# Patient Record
Sex: Male | Born: 1960 | Race: White | Hispanic: No | Smoking: Former smoker
Health system: Southern US, Community
[De-identification: ages and names within clinical notes are randomized; demographics above are authoritative.]

## PROBLEM LIST (undated history)

## (undated) DIAGNOSIS — G459 Transient cerebral ischemic attack, unspecified: Secondary | ICD-10-CM

## (undated) DIAGNOSIS — I10 Essential (primary) hypertension: Secondary | ICD-10-CM

## (undated) DIAGNOSIS — K219 Gastro-esophageal reflux disease without esophagitis: Secondary | ICD-10-CM

## (undated) DIAGNOSIS — T25021A Burn of unspecified degree of right foot, initial encounter: Secondary | ICD-10-CM

## (undated) DIAGNOSIS — E119 Type 2 diabetes mellitus without complications: Secondary | ICD-10-CM

## (undated) DIAGNOSIS — K469 Unspecified abdominal hernia without obstruction or gangrene: Secondary | ICD-10-CM

## (undated) HISTORY — PX: ROTATOR CUFF REPAIR: SHX139

## (undated) HISTORY — PX: LEG AMPUTATION BELOW KNEE: SHX694

## (undated) HISTORY — PX: WISDOM TOOTH EXTRACTION: SHX21

---

## 2003-05-11 ENCOUNTER — Emergency Department (HOSPITAL_COMMUNITY): Admission: AD | Admit: 2003-05-11 | Discharge: 2003-05-11 | Payer: Self-pay | Admitting: Family Medicine

## 2003-05-12 ENCOUNTER — Emergency Department (HOSPITAL_COMMUNITY): Admission: AD | Admit: 2003-05-12 | Discharge: 2003-05-12 | Payer: Self-pay | Admitting: Family Medicine

## 2003-05-13 ENCOUNTER — Emergency Department (HOSPITAL_COMMUNITY): Admission: AD | Admit: 2003-05-13 | Discharge: 2003-05-13 | Payer: Self-pay | Admitting: Family Medicine

## 2004-09-01 ENCOUNTER — Emergency Department (HOSPITAL_COMMUNITY): Admission: EM | Admit: 2004-09-01 | Discharge: 2004-09-01 | Payer: Self-pay | Admitting: Family Medicine

## 2007-03-25 ENCOUNTER — Ambulatory Visit: Payer: Self-pay | Admitting: Internal Medicine

## 2007-03-25 LAB — CONVERTED CEMR LAB
ALT: 11 units/L (ref 0–53)
AST: 18 units/L (ref 0–37)
Basophils Absolute: 0.1 10*3/uL (ref 0.0–0.1)
Basophils Relative: 1 % (ref 0–1)
CO2: 23 meq/L (ref 19–32)
Cholesterol: 200 mg/dL (ref 0–200)
Creatinine, Ser: 0.81 mg/dL (ref 0.40–1.50)
Eosinophils Relative: 3 % (ref 0–5)
HCT: 46.1 % (ref 39.0–52.0)
Hemoglobin: 15.2 g/dL (ref 13.0–17.0)
MCHC: 33 g/dL (ref 30.0–36.0)
MCV: 93.3 fL (ref 78.0–100.0)
Monocytes Absolute: 0.7 10*3/uL (ref 0.2–0.7)
RDW: 13 % (ref 11.5–14.0)
Total Bilirubin: 0.4 mg/dL (ref 0.3–1.2)
Total CHOL/HDL Ratio: 5

## 2007-03-26 ENCOUNTER — Ambulatory Visit: Payer: Self-pay | Admitting: *Deleted

## 2007-04-02 ENCOUNTER — Ambulatory Visit: Payer: Self-pay | Admitting: Internal Medicine

## 2007-04-08 ENCOUNTER — Ambulatory Visit: Payer: Self-pay | Admitting: Internal Medicine

## 2009-01-07 ENCOUNTER — Observation Stay (HOSPITAL_COMMUNITY): Admission: EM | Admit: 2009-01-07 | Discharge: 2009-01-12 | Payer: Self-pay | Admitting: Emergency Medicine

## 2009-01-07 ENCOUNTER — Ambulatory Visit: Payer: Self-pay | Admitting: Internal Medicine

## 2009-01-08 ENCOUNTER — Encounter (INDEPENDENT_AMBULATORY_CARE_PROVIDER_SITE_OTHER): Payer: Self-pay | Admitting: Emergency Medicine

## 2010-09-25 LAB — BASIC METABOLIC PANEL
BUN: 12 mg/dL (ref 6–23)
CO2: 27 mEq/L (ref 19–32)
CO2: 28 mEq/L (ref 19–32)
Calcium: 9 mg/dL (ref 8.4–10.5)
Calcium: 9.3 mg/dL (ref 8.4–10.5)
Calcium: 9.4 mg/dL (ref 8.4–10.5)
Chloride: 102 mEq/L (ref 96–112)
Chloride: 96 mEq/L (ref 96–112)
Creatinine, Ser: 0.74 mg/dL (ref 0.4–1.5)
Creatinine, Ser: 0.75 mg/dL (ref 0.4–1.5)
Creatinine, Ser: 0.77 mg/dL (ref 0.4–1.5)
Creatinine, Ser: 0.84 mg/dL (ref 0.4–1.5)
GFR calc Af Amer: >60 mL/min (ref >60)
GFR calc Af Amer: >60 mL/min (ref >60)
GFR calc Af Amer: >60 mL/min (ref >60)
GFR calc Af Amer: >60 mL/min (ref >60)
GFR calc non Af Amer: >60 mL/min (ref >60)
GFR calc non Af Amer: >60 mL/min (ref >60)
Glucose, Bld: 213 mg/dL — ABNORMAL HIGH (ref 70–99)
Glucose, Bld: 235 mg/dL — ABNORMAL HIGH (ref 70–99)
Sodium: 133 mEq/L — ABNORMAL LOW (ref 135–145)
Sodium: 135 mEq/L (ref 135–145)
Sodium: 139 mEq/L (ref 135–145)

## 2010-09-25 LAB — GLUCOSE, CAPILLARY
Glucose-Capillary: 119 mg/dL — ABNORMAL HIGH (ref 70–99)
Glucose-Capillary: 165 mg/dL — ABNORMAL HIGH (ref 70–99)
Glucose-Capillary: 172 mg/dL — ABNORMAL HIGH (ref 70–99)
Glucose-Capillary: 189 mg/dL — ABNORMAL HIGH (ref 70–99)
Glucose-Capillary: 206 mg/dL — ABNORMAL HIGH (ref 70–99)
Glucose-Capillary: 222 mg/dL — ABNORMAL HIGH (ref 70–99)
Glucose-Capillary: 234 mg/dL — ABNORMAL HIGH (ref 70–99)
Glucose-Capillary: 273 mg/dL — ABNORMAL HIGH (ref 70–99)
Glucose-Capillary: 276 mg/dL — ABNORMAL HIGH (ref 70–99)

## 2010-09-25 LAB — CBC
HCT: 39.3 % (ref 39.0–52.0)
HCT: 40.5 % (ref 39.0–52.0)
HCT: 41.7 % (ref 39.0–52.0)
HCT: 42.7 % (ref 39.0–52.0)
Hemoglobin: 13.1 g/dL (ref 13.0–17.0)
Hemoglobin: 13.6 g/dL (ref 13.0–17.0)
Hemoglobin: 14.3 g/dL (ref 13.0–17.0)
Hemoglobin: 15.1 g/dL (ref 13.0–17.0)
MCHC: 34 g/dL (ref 30.0–36.0)
MCHC: 34.5 g/dL (ref 30.0–36.0)
MCHC: 34.8 g/dL (ref 30.0–36.0)
MCHC: 35 g/dL (ref 30.0–36.0)
MCV: 91 fL (ref 78.0–100.0)
MCV: 91.2 fL (ref 78.0–100.0)
MCV: 91.6 fL (ref 78.0–100.0)
MCV: 92.4 fL (ref 78.0–100.0)
Platelets: 207 10*3/uL (ref 150–400)
Platelets: 212 10*3/uL (ref 150–400)
Platelets: 217 10*3/uL (ref 150–400)
Platelets: 218 10*3/uL (ref 150–400)
Platelets: 235 10*3/uL (ref 150–400)
RBC: 4.11 MIL/uL — ABNORMAL LOW (ref 4.22–5.81)
RBC: 4.57 MIL/uL (ref 4.22–5.81)
RBC: 4.6 MIL/uL (ref 4.22–5.81)
RBC: 4.69 MIL/uL (ref 4.22–5.81)
RDW: 12.4 % (ref 11.5–15.5)
RDW: 12.5 % (ref 11.5–15.5)
RDW: 12.7 % (ref 11.5–15.5)
RDW: 12.8 % (ref 11.5–15.5)
WBC: 8.3 10*3/uL (ref 4.0–10.5)
WBC: 9.7 10*3/uL (ref 4.0–10.5)
WBC: 9.8 10*3/uL (ref 4.0–10.5)

## 2010-09-25 LAB — CK TOTAL AND CKMB (NOT AT ARMC)
CK, MB: 1.1 ng/mL (ref 0.3–4.0)
Relative Index: INVALID (ref 0.0–2.5)
Total CK: 32 U/L (ref 7–232)
Total CK: 37 U/L (ref 7–232)

## 2010-09-25 LAB — DIFFERENTIAL
Basophils Absolute: 0.1 10*3/uL (ref 0.0–0.1)
Basophils Relative: 1 % (ref 0–1)
Basophils Relative: 1 % (ref 0–1)
Eosinophils Absolute: 0.2 10*3/uL (ref 0.0–0.7)
Eosinophils Absolute: 0.3 10*3/uL (ref 0.0–0.7)
Eosinophils Relative: 2 % (ref 0–5)
Eosinophils Relative: 2 % (ref 0–5)
Eosinophils Relative: 4 % (ref 0–5)
Lymphocytes Relative: 15 % (ref 12–46)
Lymphocytes Relative: 25 % (ref 12–46)
Lymphs Abs: 1.8 10*3/uL (ref 0.7–4.0)
Lymphs Abs: 2.5 10*3/uL (ref 0.7–4.0)
Lymphs Abs: 2.7 10*3/uL (ref 0.7–4.0)
Monocytes Absolute: 0.8 10*3/uL (ref 0.1–1.0)
Monocytes Relative: 8 % (ref 3–12)
Monocytes Relative: 8 % (ref 3–12)
Monocytes Relative: 8 % (ref 3–12)
Neutro Abs: 9.1 10*3/uL — ABNORMAL HIGH (ref 1.7–7.7)
Neutrophils Relative %: 54 % (ref 43–77)
Neutrophils Relative %: 64 % (ref 43–77)
Neutrophils Relative %: 76 % (ref 43–77)

## 2010-09-25 LAB — CARDIAC PANEL(CRET KIN+CKTOT+MB+TROPI)
CK, MB: 0.9 ng/mL (ref 0.3–4.0)
Relative Index: INVALID (ref 0.0–2.5)
Relative Index: INVALID (ref 0.0–2.5)
Total CK: 39 U/L (ref 7–232)
Troponin I: 0.01 ng/mL (ref 0.00–0.06)
Troponin I: 0.02 ng/mL (ref 0.00–0.06)
Troponin I: 0.03 ng/mL (ref 0.00–0.06)

## 2010-09-25 LAB — PROTIME-INR: Prothrombin Time: 12.1 seconds (ref 11.6–15.2)

## 2010-09-25 LAB — COMPREHENSIVE METABOLIC PANEL
ALT: 12 U/L (ref 0–53)
BUN: 20 mg/dL (ref 6–23)
Calcium: 9.2 mg/dL (ref 8.4–10.5)
Creatinine, Ser: 1.06 mg/dL (ref 0.4–1.5)
Glucose, Bld: 329 mg/dL — ABNORMAL HIGH (ref 70–99)
Sodium: 130 mEq/L — ABNORMAL LOW (ref 135–145)
Total Protein: 6.8 g/dL (ref 6.0–8.3)

## 2010-09-25 LAB — TROPONIN I
Troponin I: 0.02 ng/mL (ref 0.00–0.06)
Troponin I: 0.03 ng/mL (ref 0.00–0.06)

## 2010-09-25 LAB — D-DIMER, QUANTITATIVE: D-Dimer, Quant: 1.06 ug/mL-FEU — ABNORMAL HIGH (ref 0.00–0.48)

## 2010-09-25 LAB — LIPID PANEL
Cholesterol: 236 mg/dL — ABNORMAL HIGH (ref 0–200)
LDL Cholesterol: UNDETERMINED mg/dL (ref 0–99)
Total CHOL/HDL Ratio: 6.2 RATIO
Triglycerides: 435 mg/dL — ABNORMAL HIGH (ref <150)
VLDL: UNDETERMINED mg/dL (ref 0–40)
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
## 2010-11-01 NOTE — Cardiovascular Report (Signed)
NAMEDRACEN, REIGLE               ACCOUNT NO.:  [id number]   MEDICAL RECORD NO.:  [id number]          PATIENT TYPE:  OBV   LOCATION:  4741                         FACILITY:  [facility]   PHYSICIAN:  Sheliah Mends, MD      DATE OF BIRTH:  February 18, 1961   DATE OF PROCEDURE:  01/11/2009  DATE OF DISCHARGE:                            CARDIAC CATHETERIZATION   Mr. Szumski is a 50 year old gentleman with history of tobacco  dependence and type 2 diabetes mellitus who presented to [facility] with new onset of chest pain.  He underwent a myocardial  perfusion scan as well as a transthoracic echocardiogram that showed  posterior and inferior hypokineses.  There were equivocal findings  regarding ischemia.  Given Mr. Ewalt's symptoms and risk factors, a  decision was made to proceed with cardiac catheterization.   PROCEDURE:  After informed consent was obtained, the patient was brought  to the Second Floor Cardiac Cath Lab in the postabsorptive state.  He  was draped in a sterile fashion.  Local anesthesia was applied to the  right groin using 1% lidocaine.  The patient was started on conscious  sedation with Versed and fentanyl.  A 5-French arterial sheath was  inserted into the right femoral artery using the modified Seldinger  technique.  Subsequently, a 5-French #4 Judkins left and right was used  for selective coronary angiography.  The 5-French right Judkins #4  catheter was exchanged for no torque catheter.  The pigtail catheter was  used for aortic root chart and left ventriculography.   FINDINGS:  Hemodynamics:  Left ventricular pressure 148/9 mmHg.  Aortic  pressure 149/68 mmHg.  No significant aortic valve gradient.   LEFT VENTRICULOGRAPHY:  Left ventriculography shows normal left  ventricular function with ejection fraction of 60%.   SELECTIVE CORONARY ANGIOGRAPHY:  1. Left main coronary artery:  Left main coronary artery is a short,      large caliber vessel that  bifurcates into the LAD and left      circumflex artery.  The left main coronary artery has no      angiographically significant disease.  2. Left anterior descending artery:  The left anterior descending      artery is a medium-sized vessel that gives 2 smaller diagonal      branches off.  There is no significant disease seen in the LAD      territory.  3. Left circumflex artery:  Left circumflex artery is a large vessel      that gives off a branching obtuse marginal branch 1 as well as a      PDA artery.  The left circumflex artery is a dominant vessel.      There is no angiographically significant disease seen.  4. Right coronary artery:  The right coronary artery is very small.  I      was unable using a 5-French right Judkins catheter and/or torque      catheter to selectively engage the right coronary artery.  A      subsequent aortic root chart showed a very small vessel.  SUMMARY:  1. No hemodynamic significant coronary artery disease.  2. No significant valve disease.  3. Normal left ventricular function.  4. 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   The procedure was completed without complication.   RECOMMENDATIONS:  Mr. Barthold should be continued on medical therapy  with smoking cessation and aggressive risk factor modification.      Sheliah Mends, MD  Electronically Signed     JE/MEDQ  D:  01/11/2009  T:  01/12/2009  Job:  [id number]

## 2010-11-01 NOTE — Discharge Summary (Signed)
Jeffrey Barber, Jeffrey Barber               ACCOUNT NO.:  [id number]   MEDICAL RECORD NO.:  [id number]          PATIENT TYPE:  OBV   LOCATION:  4741                         FACILITY:  [facility]   PHYSICIAN:  Theodosia Paling, MD    DATE OF BIRTH:  Apr 24, 1961   DATE OF ADMISSION:  01/06/2009  DATE OF DISCHARGE:                               DISCHARGE SUMMARY   PRIMARY CARE PHYSICIAN:  Armen Pickup, MD, at [facility].   ADMITTING HISTORY:  Please refer to my admission note dictated by me  under history of present illness.   DISCHARGE DIAGNOSIS:  Chest pain, likely noncardiac with negative  cardiac cath enzymes and EKG.   SECONDARY DIAGNOSES:  1. History of hypertension.  2. History of diabetes mellitus.  3. History of anxiety.  4. History of tobacco abuse.  5. History of alcohol abuse.   Discharge medications are the following:  1. Lisinopril 20 mg p.o. daily.  2. Hydrochlorothiazide 12.5 mg p.o. daily  3. Metformin 1 g p.o. q.12 h.  4. Ambien 2.5 mg p.o. at bedtime p.r.n.  5. Trazodone 50 mg p.o. at bedtime p.r.n.   HOSPITAL COURSE:  Following issues were addressed during the  hospitalization:  1. Chest pain.  The patient had several comorbid condition for CAD,      therefore cardiology consultation from [facility] was      performed.  He finally underwent cardiac catheterization on January 11, 2009, which showed no evidence of coronary artery disease and      normal ejection fraction.  The patient tolerated the procedure      fine.  He is symptomatic and hemodynamically stable.  His three      sets of cardiac enzymes and EKG was also normal.  2. Tobacco abuse.  The patient was put on nicotine patch, was      counseled against the use of tobacco.  3. Hypertension.  The patient will resume lisinopril and      hydrochlorothiazide on discharge.  4. Diabetes mellitus.  The patient will continue home dose of      metformin.   DISPOSITION:  The patient will follow up  with primary care physician in  1 week's time for the management of hypertension and diabetes.   PROCEDURE PERFORMED:  Consultation was performed as mentioned under  hospital course.  Echocardiogram performed on January 08, 2009, showed left  ventricle normal cavity site with normal left ventricular relaxation,  grade 1 diastolic dysfunction, and mild mitral valve regurgitation.      Theodosia Paling, MD  Electronically Signed     NP/MEDQ  D:  01/11/2009  T:  01/12/2009  Job:  [id number]   cc:   Armen Pickup, MD

## 2015-12-03 DIAGNOSIS — G459 Transient cerebral ischemic attack, unspecified: Secondary | ICD-10-CM

## 2015-12-03 HISTORY — DX: Transient cerebral ischemic attack, unspecified: G45.9

## 2016-06-19 DIAGNOSIS — T25021A Burn of unspecified degree of right foot, initial encounter: Secondary | ICD-10-CM

## 2016-06-19 HISTORY — DX: Burn of unspecified degree of right foot, initial encounter: T25.021A

## 2016-07-16 ENCOUNTER — Encounter (HOSPITAL_BASED_OUTPATIENT_CLINIC_OR_DEPARTMENT_OTHER): Payer: Self-pay | Admitting: Emergency Medicine

## 2016-07-16 ENCOUNTER — Inpatient Hospital Stay (HOSPITAL_BASED_OUTPATIENT_CLINIC_OR_DEPARTMENT_OTHER)
Admission: EM | Admit: 2016-07-16 | Discharge: 2016-07-25 | DRG: 271 | Disposition: A | Discharge status: Home / Self Care | Payer: Self-pay | Attending: Internal Medicine | Admitting: Internal Medicine

## 2016-07-16 ENCOUNTER — Emergency Department (HOSPITAL_BASED_OUTPATIENT_CLINIC_OR_DEPARTMENT_OTHER): Payer: Self-pay

## 2016-07-16 DIAGNOSIS — Z87891 Personal history of nicotine dependence: Secondary | ICD-10-CM

## 2016-07-16 DIAGNOSIS — E1165 Type 2 diabetes mellitus with hyperglycemia: Secondary | ICD-10-CM | POA: Diagnosis present

## 2016-07-16 DIAGNOSIS — E1152 Type 2 diabetes mellitus with diabetic peripheral angiopathy with gangrene: Secondary | ICD-10-CM

## 2016-07-16 DIAGNOSIS — Z833 Family history of diabetes mellitus: Secondary | ICD-10-CM

## 2016-07-16 DIAGNOSIS — T25021S Burn of unspecified degree of right foot, sequela: Secondary | ICD-10-CM

## 2016-07-16 DIAGNOSIS — Z7902 Long term (current) use of antithrombotics/antiplatelets: Secondary | ICD-10-CM

## 2016-07-16 DIAGNOSIS — L0390 Cellulitis, unspecified: Secondary | ICD-10-CM | POA: Diagnosis present

## 2016-07-16 DIAGNOSIS — Z888 Allergy status to other drugs, medicaments and biological substances status: Secondary | ICD-10-CM

## 2016-07-16 DIAGNOSIS — E1151 Type 2 diabetes mellitus with diabetic peripheral angiopathy without gangrene: Secondary | ICD-10-CM | POA: Diagnosis present

## 2016-07-16 DIAGNOSIS — K219 Gastro-esophageal reflux disease without esophagitis: Secondary | ICD-10-CM | POA: Diagnosis present

## 2016-07-16 DIAGNOSIS — L03115 Cellulitis of right lower limb: Secondary | ICD-10-CM | POA: Diagnosis present

## 2016-07-16 DIAGNOSIS — I70201 Unspecified atherosclerosis of native arteries of extremities, right leg: Secondary | ICD-10-CM | POA: Diagnosis present

## 2016-07-16 DIAGNOSIS — I96 Gangrene, not elsewhere classified: Principal | ICD-10-CM | POA: Diagnosis present

## 2016-07-16 DIAGNOSIS — I10 Essential (primary) hypertension: Secondary | ICD-10-CM | POA: Diagnosis present

## 2016-07-16 DIAGNOSIS — Z8673 Personal history of transient ischemic attack (TIA), and cerebral infarction without residual deficits: Secondary | ICD-10-CM

## 2016-07-16 DIAGNOSIS — T25221S Burn of second degree of right foot, sequela: Secondary | ICD-10-CM

## 2016-07-16 DIAGNOSIS — E781 Pure hyperglyceridemia: Secondary | ICD-10-CM | POA: Diagnosis present

## 2016-07-16 DIAGNOSIS — Z79899 Other long term (current) drug therapy: Secondary | ICD-10-CM

## 2016-07-16 DIAGNOSIS — E119 Type 2 diabetes mellitus without complications: Secondary | ICD-10-CM

## 2016-07-16 DIAGNOSIS — Z89512 Acquired absence of left leg below knee: Secondary | ICD-10-CM

## 2016-07-16 DIAGNOSIS — Z794 Long term (current) use of insulin: Secondary | ICD-10-CM

## 2016-07-16 DIAGNOSIS — E1142 Type 2 diabetes mellitus with diabetic polyneuropathy: Secondary | ICD-10-CM | POA: Diagnosis present

## 2016-07-16 DIAGNOSIS — X16XXXS Contact with hot heating appliances, radiators and pipes, sequela: Secondary | ICD-10-CM | POA: Diagnosis present

## 2016-07-16 HISTORY — DX: Essential (primary) hypertension: I10

## 2016-07-16 HISTORY — DX: Gastro-esophageal reflux disease without esophagitis: K21.9

## 2016-07-16 HISTORY — DX: Transient cerebral ischemic attack, unspecified: G45.9

## 2016-07-16 HISTORY — DX: Type 2 diabetes mellitus without complications: E11.9

## 2016-07-16 HISTORY — DX: Burn of unspecified degree of right foot, initial encounter: T25.021A

## 2016-07-16 HISTORY — DX: Unspecified abdominal hernia without obstruction or gangrene: K46.9

## 2016-07-16 LAB — CBC WITH DIFFERENTIAL/PLATELET
BAND NEUTROPHILS: 5 %
Basophils Absolute: 0.1 10*3/uL (ref 0.0–0.1)
Basophils Relative: 1 %
EOS PCT: 3 %
Eosinophils Absolute: 0.3 10*3/uL (ref 0.0–0.7)
HEMATOCRIT: 36.8 % — AB (ref 39.0–52.0)
HEMOGLOBIN: 12.7 g/dL — AB (ref 13.0–17.0)
LYMPHS ABS: 1.1 10*3/uL (ref 0.7–4.0)
Lymphocytes Relative: 12 %
MCH: 28.6 pg (ref 26.0–34.0)
MCHC: 34.5 g/dL (ref 30.0–36.0)
MCV: 82.9 fL (ref 78.0–100.0)
MONOS PCT: 6 %
Monocytes Absolute: 0.6 10*3/uL (ref 0.1–1.0)
NEUTROS ABS: 7.4 10*3/uL (ref 1.7–7.7)
Neutrophils Relative %: 73 %
Platelets: 294 10*3/uL (ref 150–400)
RBC: 4.44 MIL/uL (ref 4.22–5.81)
RDW: 13.6 % (ref 11.5–15.5)
WBC: 9.5 10*3/uL (ref 4.0–10.5)

## 2016-07-16 LAB — COMPREHENSIVE METABOLIC PANEL
ALBUMIN: 2.3 g/dL — AB (ref 3.5–5.0)
ALK PHOS: 109 U/L (ref 38–126)
ALT: 7 U/L — AB (ref 17–63)
AST: 20 U/L (ref 15–41)
Anion gap: 8 (ref 5–15)
BUN: 10 mg/dL (ref 6–20)
CALCIUM: 8.1 mg/dL — AB (ref 8.9–10.3)
CO2: 25 mmol/L (ref 22–32)
CREATININE: 1 mg/dL (ref 0.61–1.24)
Chloride: 99 mmol/L — ABNORMAL LOW (ref 101–111)
GFR calc non Af Amer: >60 mL/min (ref >60)
GLUCOSE: 250 mg/dL — AB (ref 65–99)
Potassium: 4.2 mmol/L (ref 3.5–5.1)
SODIUM: 132 mmol/L — AB (ref 135–145)
TOTAL PROTEIN: 6.4 g/dL — AB (ref 6.5–8.1)
Total Bilirubin: 0.4 mg/dL (ref 0.3–1.2)

## 2016-07-16 LAB — GLUCOSE, CAPILLARY: GLUCOSE-CAPILLARY: 204 mg/dL — AB (ref 65–99)

## 2016-07-16 LAB — TROPONIN I: Troponin I: <0.03 ng/mL (ref <0.03)

## 2016-07-16 LAB — I-STAT CG4 LACTIC ACID, ED
LACTIC ACID, VENOUS: 0.64 mmol/L (ref 0.5–1.9)
Lactic Acid, Venous: 0.72 mmol/L (ref 0.5–1.9)

## 2016-07-16 MED ORDER — VANCOMYCIN HCL IN DEXTROSE 1-5 GM/200ML-% IV SOLN
1000.0000 mg | Freq: Once | INTRAVENOUS | Status: AC
  Administered 2016-07-16: 1000 mg via INTRAVENOUS
  Filled 2016-07-16: qty 200

## 2016-07-16 MED ORDER — MORPHINE SULFATE (PF) 4 MG/ML IV SOLN
4.0000 mg | Freq: Once | INTRAVENOUS | Status: AC
  Administered 2016-07-16: 4 mg via INTRAVENOUS
  Filled 2016-07-16: qty 1

## 2016-07-16 MED ORDER — PIPERACILLIN-TAZOBACTAM 3.375 G IVPB 30 MIN
3.3750 g | Freq: Once | INTRAVENOUS | Status: AC
  Administered 2016-07-16: 3.375 g via INTRAVENOUS
  Filled 2016-07-16 (×2): qty 50

## 2016-07-16 MED ORDER — VANCOMYCIN HCL IN DEXTROSE 1-5 GM/200ML-% IV SOLN
1000.0000 mg | Freq: Two times a day (BID) | INTRAVENOUS | Status: DC
  Administered 2016-07-17 – 2016-07-18 (×4): 1000 mg via INTRAVENOUS
  Filled 2016-07-16 (×5): qty 200

## 2016-07-16 MED ORDER — PIPERACILLIN-TAZOBACTAM 3.375 G IVPB
3.3750 g | Freq: Three times a day (TID) | INTRAVENOUS | Status: DC
  Administered 2016-07-17 – 2016-07-22 (×15): 3.375 g via INTRAVENOUS
  Filled 2016-07-16 (×20): qty 50

## 2016-07-16 MED ORDER — SODIUM CHLORIDE 0.9 % IV BOLUS (SEPSIS)
1000.0000 mL | Freq: Once | INTRAVENOUS | Status: AC
  Administered 2016-07-16: 1000 mL via INTRAVENOUS

## 2016-07-16 NOTE — ED Notes (Addendum)
Dayshift EMT stated that the EKG was not done. This EMT will perform EKG as soon as the nurse finishes with blood cultures.
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
## 2016-07-16 NOTE — ED Notes (Signed)
Report to RuskinRonnie, RN on 5N.

## 2016-07-17 ENCOUNTER — Inpatient Hospital Stay (HOSPITAL_COMMUNITY): Payer: Self-pay

## 2016-07-17 ENCOUNTER — Encounter (HOSPITAL_COMMUNITY): Payer: Self-pay | Admitting: Internal Medicine

## 2016-07-17 DIAGNOSIS — I739 Peripheral vascular disease, unspecified: Secondary | ICD-10-CM

## 2016-07-17 DIAGNOSIS — I10 Essential (primary) hypertension: Secondary | ICD-10-CM | POA: Diagnosis present

## 2016-07-17 DIAGNOSIS — M79609 Pain in unspecified limb: Secondary | ICD-10-CM

## 2016-07-17 DIAGNOSIS — I96 Gangrene, not elsewhere classified: Secondary | ICD-10-CM | POA: Diagnosis present

## 2016-07-17 DIAGNOSIS — E119 Type 2 diabetes mellitus without complications: Secondary | ICD-10-CM

## 2016-07-17 DIAGNOSIS — T25021S Burn of unspecified degree of right foot, sequela: Secondary | ICD-10-CM

## 2016-07-17 LAB — BASIC METABOLIC PANEL
ANION GAP: 8 (ref 5–15)
BUN: 7 mg/dL (ref 6–20)
CO2: 26 mmol/L (ref 22–32)
Calcium: 8.3 mg/dL — ABNORMAL LOW (ref 8.9–10.3)
Chloride: 102 mmol/L (ref 101–111)
Creatinine, Ser: 1.09 mg/dL (ref 0.61–1.24)
Glucose, Bld: 191 mg/dL — ABNORMAL HIGH (ref 65–99)
POTASSIUM: 3.8 mmol/L (ref 3.5–5.1)
SODIUM: 136 mmol/L (ref 135–145)

## 2016-07-17 LAB — GLUCOSE, CAPILLARY
GLUCOSE-CAPILLARY: 191 mg/dL — AB (ref 65–99)
GLUCOSE-CAPILLARY: 224 mg/dL — AB (ref 65–99)
GLUCOSE-CAPILLARY: 298 mg/dL — AB (ref 65–99)
Glucose-Capillary: 188 mg/dL — ABNORMAL HIGH (ref 65–99)

## 2016-07-17 LAB — CBC WITH DIFFERENTIAL/PLATELET
BASOS ABS: 0.1 10*3/uL (ref 0.0–0.1)
Basophils Relative: 1 %
Eosinophils Absolute: 0.3 10*3/uL (ref 0.0–0.7)
Eosinophils Relative: 3 %
HEMATOCRIT: 37.7 % — AB (ref 39.0–52.0)
Hemoglobin: 12.6 g/dL — ABNORMAL LOW (ref 13.0–17.0)
LYMPHS PCT: 15 %
Lymphs Abs: 1.6 10*3/uL (ref 0.7–4.0)
MCH: 27.8 pg (ref 26.0–34.0)
MCHC: 33.4 g/dL (ref 30.0–36.0)
MCV: 83.2 fL (ref 78.0–100.0)
Monocytes Absolute: 1 10*3/uL (ref 0.1–1.0)
Monocytes Relative: 10 %
NEUTROS ABS: 7.3 10*3/uL (ref 1.7–7.7)
NEUTROS PCT: 71 %
Platelets: 288 10*3/uL (ref 150–400)
RBC: 4.53 MIL/uL (ref 4.22–5.81)
RDW: 13.6 % (ref 11.5–15.5)
WBC: 10.2 10*3/uL (ref 4.0–10.5)

## 2016-07-17 LAB — VAS US LOWER EXTREMITY ARTERIAL DUPLEX
RIGHT ANT DIST TIBAL SYS PSV: 62 cm/s
RIGHT POST TIB DIST SYS: -59 cm/s
RIGHT POST TIB MID SYS: 87 cm/s
RSFDPSV: -94 cm/s
RSFMPSV: -80 cm/s
RTSFADISTDIA: -25 cm/s
Right popliteal prox sys PSV: 95 cm/s
Right post tibial sys PSV: 70 cm/s
Right super femoral prox sys PSV: -148 cm/s

## 2016-07-17 LAB — C-REACTIVE PROTEIN: CRP: 6.6 mg/dL — ABNORMAL HIGH (ref <1.0)

## 2016-07-17 LAB — SEDIMENTATION RATE: Sed Rate: 79 mm/hr — ABNORMAL HIGH (ref 0–16)

## 2016-07-17 MED ORDER — FAMOTIDINE 20 MG PO TABS
20.0000 mg | ORAL_TABLET | Freq: Two times a day (BID) | ORAL | Status: DC
  Administered 2016-07-17 – 2016-07-25 (×17): 20 mg via ORAL
  Filled 2016-07-17 (×17): qty 1

## 2016-07-17 MED ORDER — LORAZEPAM 1 MG PO TABS
1.0000 mg | ORAL_TABLET | Freq: Three times a day (TID) | ORAL | Status: DC
  Administered 2016-07-17 – 2016-07-25 (×27): 1 mg via ORAL
  Filled 2016-07-17 (×27): qty 1

## 2016-07-17 MED ORDER — INSULIN ASPART 100 UNIT/ML ~~LOC~~ SOLN
0.0000 [IU] | Freq: Three times a day (TID) | SUBCUTANEOUS | Status: DC
  Administered 2016-07-17 (×2): 2 [IU] via SUBCUTANEOUS
  Administered 2016-07-17 – 2016-07-18 (×2): 3 [IU] via SUBCUTANEOUS

## 2016-07-17 MED ORDER — CLOPIDOGREL BISULFATE 75 MG PO TABS
75.0000 mg | ORAL_TABLET | Freq: Every day | ORAL | Status: DC
  Administered 2016-07-17 – 2016-07-25 (×8): 75 mg via ORAL
  Filled 2016-07-17 (×8): qty 1

## 2016-07-17 MED ORDER — METFORMIN HCL 500 MG PO TABS: 1000.0000 mg | ORAL_TABLET | Freq: Two times a day (BID) | ORAL | Status: DC

## 2016-07-17 MED ORDER — GLUCERNA SHAKE PO LIQD
237.0000 mL | Freq: Two times a day (BID) | ORAL | Status: DC
  Administered 2016-07-17 – 2016-07-25 (×12): 237 mL via ORAL
  Filled 2016-07-17: qty 237

## 2016-07-17 MED ORDER — GADOBENATE DIMEGLUMINE 529 MG/ML IV SOLN
15.0000 mL | Freq: Once | INTRAVENOUS | Status: AC | PRN
  Administered 2016-07-17: 15 mL via INTRAVENOUS

## 2016-07-17 MED ORDER — HYDRALAZINE HCL 25 MG PO TABS
25.0000 mg | ORAL_TABLET | Freq: Three times a day (TID) | ORAL | Status: DC
  Administered 2016-07-17 – 2016-07-18 (×5): 25 mg via ORAL
  Filled 2016-07-17 (×5): qty 1

## 2016-07-17 MED ORDER — HYDROCODONE-ACETAMINOPHEN 5-325 MG PO TABS
1.0000 | ORAL_TABLET | Freq: Four times a day (QID) | ORAL | Status: DC | PRN
  Administered 2016-07-17 – 2016-07-22 (×18): 2 via ORAL
  Filled 2016-07-17 (×18): qty 2

## 2016-07-17 MED ORDER — PRO-STAT SUGAR FREE PO LIQD
30.0000 mL | Freq: Every day | ORAL | Status: DC
  Administered 2016-07-17 – 2016-07-25 (×8): 30 mL via ORAL
  Filled 2016-07-17 (×8): qty 30

## 2016-07-17 MED ORDER — AMLODIPINE BESYLATE 5 MG PO TABS
5.0000 mg | ORAL_TABLET | Freq: Every day | ORAL | Status: DC
  Administered 2016-07-17: 5 mg via ORAL
  Filled 2016-07-17: qty 1

## 2016-07-17 MED ORDER — CLONIDINE HCL 0.2 MG PO TABS
0.3000 mg | ORAL_TABLET | Freq: Two times a day (BID) | ORAL | Status: DC
  Administered 2016-07-17 – 2016-07-25 (×17): 0.3 mg via ORAL
  Filled 2016-07-17 (×17): qty 1

## 2016-07-17 MED ORDER — ENOXAPARIN SODIUM 40 MG/0.4ML ~~LOC~~ SOLN
40.0000 mg | Freq: Every day | SUBCUTANEOUS | Status: DC
  Administered 2016-07-17 – 2016-07-24 (×8): 40 mg via SUBCUTANEOUS
  Filled 2016-07-17 (×9): qty 0.4

## 2016-07-17 MED ORDER — HYDROCODONE-ACETAMINOPHEN 5-325 MG PO TABS: 1.0000 | ORAL_TABLET | Freq: Four times a day (QID) | ORAL | Status: DC | PRN

## 2016-07-17 NOTE — Consult Note (Signed)
WOC Nurse wound consult note Reason for Consult: right toes  Patient with a history of PAD and amputation on the left leg just less than a year ago.  Family at the bedside with patient. 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 Dressing procedure/placement/frequency: Explained the difference to family and patient between vascular doctor and orthopedic surgeon.  Explained the need for good blood flow to the LE in order to heal wounds and the cause for black toes.  I explained my role was for topical care recommendations but due to the lack of blood flow a vascular surgeon or orthopedic surgeon would be making any decisions about further interventions.   I have added betadine topically in hopes of keeping the toes dry and stable.  Discussed POC with patient and bedside nurse.  Re consult if needed, will not follow at this time. Thanks  Javious Hallisey M.D.C. [employer] MSN, RN,CWOCN, CNS [phone])

## 2016-07-17 NOTE — Progress Notes (Signed)
Inpatient Diabetes Program Recommendations  AACE/ADA: New Consensus Statement on Inpatient Glycemic Control (2015)  Target Ranges:  Prepandial:   less than 140 mg/dL      Peak postprandial:   less than 180 mg/dL (1-2 hours)      Critically ill patients:  140 - 180 mg/dL   Results for Jeffrey Barber, [name] (MRN [id number]) as of 07/17/2016 09:17  Ref. Range 07/16/2016 23:44 07/17/2016 06:10  Glucose-Capillary Latest Ref Range: 65 - 99 mg/dL 782204 (H) 956191 (H)    Admit with: Gangrene R Foot  History: DM2  Home DM Meds: Metformin 1000 BID  Current Insulin Orders: Novolog Sensitive Correction Scale/ SSI (0-9 units) TID AC        -Note Novolog Sensitive SSI just started this AM.    MD- Please consider placing order for current Hemoglobin A1c level     --Will follow patient during hospitalization--  Ambrose FinlandJeannine Johnston Hodari Chuba RN, MSN, CDE Diabetes Coordinator Inpatient Glycemic Control Team Team Pager: [phone] (8a-5p)
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
## 2016-07-17 NOTE — Progress Notes (Addendum)
VASCULAR LAB PRELIMINARY  ARTERIAL  ABI completed:    RIGHT    LEFT    PRESSURE WAVEFORM  PRESSURE WAVEFORM  BRACHIAL IV site Triphasic BRACHIAL 151 Triphasic  DP 81 Monophasic DP Amputation   PT 100 Biphasic PT Amputation     RIGHT LEFT  ABI 0.66 Amputation   Right ABI indicates a moderate reduction in arterial flow at rest.  Menucha Dicesare, RVS 07/17/2016, 12:20 PM

## 2016-07-17 NOTE — Progress Notes (Signed)
VASCULAR LAB PRELIMINARY  PRELIMINARY  PRELIMINARY  PRELIMINARY  Duplex scan of the right lower extremity completed.    Preliminary report:  Duplex scan of the right lower extremity revealed abnormal Doppler waveforms throughout the leg. A significant stenosis greater than 50% was noted in the mid to distal superficial femoral artery with no evidenceof a total occlusion.  Katiejo Gilroy, RVS 07/17/2016, 1:49 PM
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
## 2016-07-17 NOTE — Progress Notes (Signed)
I have seen and assessed patient and agree with Dr Gardner's assessment and plan. 

## 2016-07-18 LAB — BASIC METABOLIC PANEL
Anion gap: 9 (ref 5–15)
BUN: 8 mg/dL (ref 6–20)
CALCIUM: 8.6 mg/dL — AB (ref 8.9–10.3)
CO2: 29 mmol/L (ref 22–32)
Chloride: 101 mmol/L (ref 101–111)
Creatinine, Ser: 1.05 mg/dL (ref 0.61–1.24)
GFR calc Af Amer: >60 mL/min (ref >60)
GLUCOSE: 208 mg/dL — AB (ref 65–99)
POTASSIUM: 3.8 mmol/L (ref 3.5–5.1)
Sodium: 139 mmol/L (ref 135–145)

## 2016-07-18 LAB — CBC
HEMATOCRIT: 36.2 % — AB (ref 39.0–52.0)
Hemoglobin: 12.3 g/dL — ABNORMAL LOW (ref 13.0–17.0)
MCH: 28.5 pg (ref 26.0–34.0)
MCHC: 34 g/dL (ref 30.0–36.0)
MCV: 83.8 fL (ref 78.0–100.0)
Platelets: 295 10*3/uL (ref 150–400)
RBC: 4.32 MIL/uL (ref 4.22–5.81)
RDW: 13.6 % (ref 11.5–15.5)
WBC: 10.2 10*3/uL (ref 4.0–10.5)

## 2016-07-18 LAB — GLUCOSE, CAPILLARY
GLUCOSE-CAPILLARY: 212 mg/dL — AB (ref 65–99)
GLUCOSE-CAPILLARY: 200 mg/dL — AB (ref 65–99)
Glucose-Capillary: 179 mg/dL — ABNORMAL HIGH (ref 65–99)

## 2016-07-18 MED ORDER — PRAVASTATIN SODIUM 20 MG PO TABS
20.0000 mg | ORAL_TABLET | Freq: Every day | ORAL | Status: DC
  Administered 2016-07-18 – 2016-07-25 (×8): 20 mg via ORAL
  Filled 2016-07-18 (×8): qty 1

## 2016-07-18 MED ORDER — AMLODIPINE BESYLATE 10 MG PO TABS
10.0000 mg | ORAL_TABLET | Freq: Every day | ORAL | Status: DC
Start: 2016-07-18 — End: 2016-07-26
  Administered 2016-07-18 – 2016-07-25 (×7): 10 mg via ORAL
  Filled 2016-07-18 (×7): qty 1

## 2016-07-18 MED ORDER — HYDRALAZINE HCL 50 MG PO TABS
50.0000 mg | ORAL_TABLET | Freq: Three times a day (TID) | ORAL | Status: DC
  Administered 2016-07-19 – 2016-07-21 (×8): 50 mg via ORAL
  Filled 2016-07-18 (×9): qty 1

## 2016-07-18 MED ORDER — HYDRALAZINE HCL 25 MG PO TABS
25.0000 mg | ORAL_TABLET | Freq: Once | ORAL | Status: AC
  Administered 2016-07-18: 25 mg via ORAL
  Filled 2016-07-18: qty 1

## 2016-07-18 MED ORDER — INSULIN ASPART 100 UNIT/ML ~~LOC~~ SOLN
0.0000 [IU] | Freq: Three times a day (TID) | SUBCUTANEOUS | Status: DC
  Administered 2016-07-18 (×2): 3 [IU] via SUBCUTANEOUS
  Administered 2016-07-19 (×2): 2 [IU] via SUBCUTANEOUS
  Administered 2016-07-19: 8 [IU] via SUBCUTANEOUS
  Administered 2016-07-20 (×2): 5 [IU] via SUBCUTANEOUS
  Administered 2016-07-20: 3 [IU] via SUBCUTANEOUS
  Administered 2016-07-21: 5 [IU] via SUBCUTANEOUS
  Administered 2016-07-22: 8 [IU] via SUBCUTANEOUS
  Administered 2016-07-22: 3 [IU] via SUBCUTANEOUS
  Administered 2016-07-22: 8 [IU] via SUBCUTANEOUS
  Administered 2016-07-23 – 2016-07-25 (×4): 2 [IU] via SUBCUTANEOUS
  Administered 2016-07-25: 3 [IU] via SUBCUTANEOUS

## 2016-07-18 MED ORDER — INSULIN DETEMIR 100 UNIT/ML ~~LOC~~ SOLN: 5.0000 [IU] | Freq: Every day | SUBCUTANEOUS | Status: DC

## 2016-07-18 MED ORDER — INSULIN DETEMIR 100 UNIT/ML ~~LOC~~ SOLN
5.0000 [IU] | Freq: Every day | SUBCUTANEOUS | Status: DC
Start: 2016-07-18 — End: 2016-07-21
  Administered 2016-07-18 – 2016-07-20 (×3): 5 [IU] via SUBCUTANEOUS
  Filled 2016-07-18 (×4): qty 0.05
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
## 2016-07-18 NOTE — Progress Notes (Addendum)
Inpatient Diabetes Program Recommendations  AACE/ADA: New Consensus Statement on Inpatient Glycemic Control (2015)  Target Ranges:  Prepandial:   less than 140 mg/dL      Peak postprandial:   less than 180 mg/dL (1-2 hours)      Critically ill patients:  140 - 180 mg/dL   Review of Glycemic Control  Diabetes history: DM 2 Outpatient Diabetes medications: Levemir 5 units QHS, Metformin 1000 mg BID Current orders for Inpatient glycemic control: Levemir 5 units QHS, Novolog Sensitive TID  Inpatient Diabetes Program Recommendations:   Patient to start Levemir today. Consider adjusting Levemir dose for patient to get this am. Consider increasing correction to Novolog Moderate.  Consider A1c level to assess glucose control over the past 2-3 months.  Thanks,  Christena DeemShannon Brycelyn Gambino RN, MSN, [facility] Inpatient Diabetes Coordinator Team Pager [phone] (8a-5p)

## 2016-07-18 NOTE — Progress Notes (Signed)
Applied betadine soln to right toes as per wound care request. Air dried. Will continue to monitor.

## 2016-07-19 ENCOUNTER — Encounter (HOSPITAL_COMMUNITY): Payer: Self-pay | Admitting: General Practice

## 2016-07-19 DIAGNOSIS — I10 Essential (primary) hypertension: Secondary | ICD-10-CM

## 2016-07-19 DIAGNOSIS — L03031 Cellulitis of right toe: Secondary | ICD-10-CM

## 2016-07-19 LAB — BASIC METABOLIC PANEL
Anion gap: 9 (ref 5–15)
BUN: 7 mg/dL (ref 6–20)
CO2: 26 mmol/L (ref 22–32)
CREATININE: 1.15 mg/dL (ref 0.61–1.24)
Calcium: 8.4 mg/dL — ABNORMAL LOW (ref 8.9–10.3)
Chloride: 99 mmol/L — ABNORMAL LOW (ref 101–111)
GFR calc Af Amer: >60 mL/min (ref >60)
GLUCOSE: 155 mg/dL — AB (ref 65–99)
POTASSIUM: 3.8 mmol/L (ref 3.5–5.1)
SODIUM: 134 mmol/L — AB (ref 135–145)

## 2016-07-19 LAB — CBC
HCT: 38.1 % — ABNORMAL LOW (ref 39.0–52.0)
Hemoglobin: 12.5 g/dL — ABNORMAL LOW (ref 13.0–17.0)
MCH: 27.7 pg (ref 26.0–34.0)
MCHC: 32.8 g/dL (ref 30.0–36.0)
MCV: 84.5 fL (ref 78.0–100.0)
PLATELETS: 305 10*3/uL (ref 150–400)
RBC: 4.51 MIL/uL (ref 4.22–5.81)
RDW: 13.9 % (ref 11.5–15.5)
WBC: 10.9 10*3/uL — ABNORMAL HIGH (ref 4.0–10.5)

## 2016-07-19 LAB — GLUCOSE, CAPILLARY
GLUCOSE-CAPILLARY: 114 mg/dL — AB (ref 65–99)
GLUCOSE-CAPILLARY: 147 mg/dL — AB (ref 65–99)
Glucose-Capillary: 177 mg/dL — ABNORMAL HIGH (ref 65–99)
Glucose-Capillary: 149 mg/dL — ABNORMAL HIGH (ref 65–99)
Glucose-Capillary: 291 mg/dL — ABNORMAL HIGH (ref 65–99)

## 2016-07-19 LAB — LIPID PANEL
CHOL/HDL RATIO: 2.5 ratio
CHOLESTEROL: 132 mg/dL (ref 0–200)
HDL: 52 mg/dL (ref >40)
LDL Cholesterol: 41 mg/dL (ref 0–99)
TRIGLYCERIDES: 197 mg/dL — AB (ref <150)
VLDL: 39 mg/dL (ref 0–40)
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
## 2016-07-20 DIAGNOSIS — I96 Gangrene, not elsewhere classified: Principal | ICD-10-CM

## 2016-07-20 DIAGNOSIS — E781 Pure hyperglyceridemia: Secondary | ICD-10-CM

## 2016-07-20 LAB — GLUCOSE, CAPILLARY
GLUCOSE-CAPILLARY: 203 mg/dL — AB (ref 65–99)
GLUCOSE-CAPILLARY: 235 mg/dL — AB (ref 65–99)
Glucose-Capillary: 162 mg/dL — ABNORMAL HIGH (ref 65–99)
Glucose-Capillary: 133 mg/dL — ABNORMAL HIGH (ref 65–99)

## 2016-07-20 LAB — HEMOGLOBIN A1C
HEMOGLOBIN A1C: 12.7 % — AB (ref 4.8–5.6)
MEAN PLASMA GLUCOSE: 318 mg/dL

## 2016-07-20 LAB — SURGICAL PCR SCREEN
MRSA, PCR: NEGATIVE
Staphylococcus aureus: NEGATIVE

## 2016-07-20 NOTE — Progress Notes (Signed)
Pt seen and examined. Toes unchanged.  Agram tomorrow.  Questions answered.  Fabienne Brunsharles Rufus Beske, MD [facility] [place] Office: [phone] Pager: [phone]

## 2016-07-20 NOTE — Progress Notes (Signed)
Toes to R foot painted with betadine and left open to air per wound care order.

## 2016-07-20 NOTE — Progress Notes (Addendum)
Inpatient Diabetes Program Recommendations  AACE/ADA: New Consensus Statement on Inpatient Glycemic Control (2015)  Target Ranges:  Prepandial:   less than 140 mg/dL      Peak postprandial:   less than 180 mg/dL (1-2 hours)      Critically ill patients:  140 - 180 mg/dL   Lab Results  Component Value Date   GLUCAP 203 (H) 07/20/2016   HGBA1C 12.7 (H) 07/19/2016   Results for [name], Brynden D (MRN [id number]) as of 07/20/2016 11:31  Ref. Range 07/19/2016 00:35 07/19/2016 06:37 07/19/2016 17:50 07/19/2016 22:00 07/20/2016 06:25  Glucose-Capillary Latest Ref Range: 65 - 99 mg/dL 191147 (H) 478149 (H) 295291 (H) 114 (H) 203 (H)   Current orders for Inpatient glycemic control:   Inpatient Diabetes Program Recommendations, in order to improve healing, please consider:  Meal coverage of Novolog 4 units TIDAC if patient eats > 50% of meal;  Decreasing to Sensitive correction scale Novolog 0-9 units TIDAC.  Thank you,  Kristine LineaKaren Kathrine Rieves, RN, MSN Diabetes Coordinator Inpatient Diabetes Program [phone] (Team Pager)
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
## 2016-07-21 ENCOUNTER — Encounter (HOSPITAL_COMMUNITY)
Admission: EM | Disposition: A | Discharge status: Home / Self Care | Payer: Self-pay | Source: Home / Self Care | Attending: Internal Medicine

## 2016-07-21 ENCOUNTER — Encounter (HOSPITAL_COMMUNITY): Payer: Self-pay | Admitting: Vascular Surgery

## 2016-07-21 DIAGNOSIS — I96 Gangrene, not elsewhere classified: Secondary | ICD-10-CM

## 2016-07-21 HISTORY — PX: PERIPHERAL VASCULAR CATHETERIZATION: SHX172C

## 2016-07-21 LAB — CBC
HCT: 37 % — ABNORMAL LOW (ref 39.0–52.0)
Hemoglobin: 12.4 g/dL — ABNORMAL LOW (ref 13.0–17.0)
MCH: 28.2 pg (ref 26.0–34.0)
MCHC: 33.5 g/dL (ref 30.0–36.0)
MCV: 84.1 fL (ref 78.0–100.0)
PLATELETS: 337 10*3/uL (ref 150–400)
RBC: 4.4 MIL/uL (ref 4.22–5.81)
RDW: 13.7 % (ref 11.5–15.5)
WBC: 10.5 10*3/uL (ref 4.0–10.5)

## 2016-07-21 LAB — HEMOGLOBIN A1C
HEMOGLOBIN A1C: 12.7 % — AB (ref 4.8–5.6)
Mean Plasma Glucose: 318 mg/dL

## 2016-07-21 LAB — GLUCOSE, CAPILLARY
GLUCOSE-CAPILLARY: 170 mg/dL — AB (ref 65–99)
GLUCOSE-CAPILLARY: 209 mg/dL — AB (ref 65–99)
Glucose-Capillary: 152 mg/dL — ABNORMAL HIGH (ref 65–99)
Glucose-Capillary: 163 mg/dL — ABNORMAL HIGH (ref 65–99)
Glucose-Capillary: 164 mg/dL — ABNORMAL HIGH (ref 65–99)
Glucose-Capillary: 205 mg/dL — ABNORMAL HIGH (ref 65–99)

## 2016-07-21 LAB — BASIC METABOLIC PANEL
Anion gap: 11 (ref 5–15)
BUN: 10 mg/dL (ref 6–20)
CHLORIDE: 100 mmol/L — AB (ref 101–111)
CO2: 26 mmol/L (ref 22–32)
CREATININE: 1.16 mg/dL (ref 0.61–1.24)
Calcium: 8.8 mg/dL — ABNORMAL LOW (ref 8.9–10.3)
GFR calc Af Amer: >60 mL/min (ref >60)
GFR calc non Af Amer: >60 mL/min (ref >60)
Glucose, Bld: 153 mg/dL — ABNORMAL HIGH (ref 65–99)
Potassium: 3.5 mmol/L (ref 3.5–5.1)
SODIUM: 137 mmol/L (ref 135–145)

## 2016-07-21 LAB — PROTIME-INR
INR: 0.98
Prothrombin Time: 13 seconds (ref 11.4–15.2)

## 2016-07-21 LAB — POCT ACTIVATED CLOTTING TIME
ACTIVATED CLOTTING TIME: 142 s
Activated Clotting Time: 142 seconds

## 2016-07-21 LAB — PHOSPHORUS: Phosphorus: 3.9 mg/dL (ref 2.5–4.6)

## 2016-07-21 LAB — MAGNESIUM: Magnesium: 1.7 mg/dL (ref 1.7–2.4)

## 2016-07-21 SURGERY — ABDOMINAL AORTOGRAM W/LOWER EXTREMITY
Anesthesia: LOCAL

## 2016-07-21 MED ORDER — HEPARIN SODIUM (PORCINE) 1000 UNIT/ML IJ SOLN
INTRAMUSCULAR | Status: DC | PRN
  Administered 2016-07-21: 8000 [IU] via INTRAVENOUS

## 2016-07-21 MED ORDER — MORPHINE SULFATE (PF) 4 MG/ML IV SOLN
INTRAVENOUS | Status: AC
  Filled 2016-07-21: qty 1

## 2016-07-21 MED ORDER — HYDRALAZINE HCL 20 MG/ML IJ SOLN
5.0000 mg | INTRAMUSCULAR | Status: DC | PRN
  Administered 2016-07-21: 5 mg via INTRAVENOUS

## 2016-07-21 MED ORDER — ONDANSETRON HCL 4 MG/2ML IJ SOLN: 4.0000 mg | Freq: Four times a day (QID) | INTRAMUSCULAR | Status: DC | PRN

## 2016-07-21 MED ORDER — PHENOL 1.4 % MT LIQD
1.0000 | OROMUCOSAL | Status: DC | PRN
  Filled 2016-07-21: qty 177

## 2016-07-21 MED ORDER — CLONIDINE HCL 0.3 MG PO TABS
0.3000 mg | ORAL_TABLET | Freq: Once | ORAL | Status: AC
  Administered 2016-07-21: 0.3 mg via ORAL
  Filled 2016-07-21: qty 1

## 2016-07-21 MED ORDER — LIDOCAINE HCL (PF) 1 % IJ SOLN
INTRAMUSCULAR | Status: DC | PRN
  Administered 2016-07-21: 12 mL

## 2016-07-21 MED ORDER — ASPIRIN EC 81 MG PO TBEC
81.0000 mg | DELAYED_RELEASE_TABLET | Freq: Every day | ORAL | Status: DC
  Administered 2016-07-22 – 2016-07-25 (×4): 81 mg via ORAL
  Filled 2016-07-21 (×5): qty 1

## 2016-07-21 MED ORDER — INSULIN DETEMIR 100 UNIT/ML ~~LOC~~ SOLN
10.0000 [IU] | Freq: Every day | SUBCUTANEOUS | Status: DC
  Administered 2016-07-22 – 2016-07-25 (×4): 10 [IU] via SUBCUTANEOUS
  Filled 2016-07-21 (×4): qty 0.1

## 2016-07-21 MED ORDER — ACETAMINOPHEN 325 MG RE SUPP
325.0000 mg | RECTAL | Status: DC | PRN
  Filled 2016-07-21: qty 2

## 2016-07-21 MED ORDER — LABETALOL HCL 5 MG/ML IV SOLN
10.0000 mg | INTRAVENOUS | Status: DC | PRN
  Administered 2016-07-21 – 2016-07-22 (×2): 10 mg via INTRAVENOUS
  Filled 2016-07-21: qty 4

## 2016-07-21 MED ORDER — SODIUM CHLORIDE 0.9 % IV SOLN
INTRAVENOUS | Status: DC
  Administered 2016-07-21 (×2): via INTRAVENOUS

## 2016-07-21 MED ORDER — MORPHINE SULFATE (PF) 2 MG/ML IV SOLN
2.0000 mg | INTRAVENOUS | Status: DC | PRN
  Administered 2016-07-22 – 2016-07-23 (×2): 2 mg via INTRAVENOUS
  Administered 2016-07-23 (×5): 4 mg via INTRAVENOUS
  Administered 2016-07-24: 2 mg via INTRAVENOUS
  Administered 2016-07-24: 4 mg via INTRAVENOUS
  Administered 2016-07-24 – 2016-07-25 (×3): 2 mg via INTRAVENOUS
  Filled 2016-07-21 (×2): qty 1
  Filled 2016-07-21: qty 2
  Filled 2016-07-21: qty 1
  Filled 2016-07-21 (×3): qty 2
  Filled 2016-07-21 (×3): qty 1
  Filled 2016-07-21: qty 2
  Filled 2016-07-21: qty 1
  Filled 2016-07-21: qty 2

## 2016-07-21 MED ORDER — LABETALOL HCL 5 MG/ML IV SOLN
INTRAVENOUS | Status: DC | PRN
  Administered 2016-07-21: 10 mg via INTRAVENOUS

## 2016-07-21 MED ORDER — HEPARIN (PORCINE) IN NACL 2-0.9 UNIT/ML-% IJ SOLN
INTRAMUSCULAR | Status: AC
  Filled 2016-07-21: qty 1000

## 2016-07-21 MED ORDER — FENTANYL CITRATE (PF) 100 MCG/2ML IJ SOLN
INTRAMUSCULAR | Status: DC | PRN
  Administered 2016-07-21: 25 ug via INTRAVENOUS

## 2016-07-21 MED ORDER — IODIXANOL 320 MG/ML IV SOLN
INTRAVENOUS | Status: DC | PRN
  Administered 2016-07-21: 100 mL via INTRA_ARTERIAL

## 2016-07-21 MED ORDER — LIDOCAINE HCL (PF) 1 % IJ SOLN
INTRAMUSCULAR | Status: AC
  Filled 2016-07-21: qty 30

## 2016-07-21 MED ORDER — METOPROLOL TARTRATE 5 MG/5ML IV SOLN: 2.0000 mg | INTRAVENOUS | Status: DC | PRN

## 2016-07-21 MED ORDER — GUAIFENESIN-DM 100-10 MG/5ML PO SYRP
15.0000 mL | ORAL_SOLUTION | ORAL | Status: DC | PRN
  Filled 2016-07-21: qty 15

## 2016-07-21 MED ORDER — DOCUSATE SODIUM 100 MG PO CAPS
100.0000 mg | ORAL_CAPSULE | Freq: Every day | ORAL | Status: DC
  Administered 2016-07-22 – 2016-07-25 (×4): 100 mg via ORAL
  Filled 2016-07-21 (×4): qty 1

## 2016-07-21 MED ORDER — ALUM & MAG HYDROXIDE-SIMETH 200-200-20 MG/5ML PO SUSP
15.0000 mL | ORAL | Status: DC | PRN
  Filled 2016-07-21: qty 30

## 2016-07-21 MED ORDER — PANTOPRAZOLE SODIUM 40 MG PO TBEC
40.0000 mg | DELAYED_RELEASE_TABLET | Freq: Every day | ORAL | Status: DC
  Administered 2016-07-22 – 2016-07-25 (×4): 40 mg via ORAL
  Filled 2016-07-21 (×4): qty 1

## 2016-07-21 MED ORDER — LABETALOL HCL 5 MG/ML IV SOLN
INTRAVENOUS | Status: AC
  Filled 2016-07-21: qty 4

## 2016-07-21 MED ORDER — HEPARIN (PORCINE) IN NACL 2-0.9 UNIT/ML-% IJ SOLN
INTRAMUSCULAR | Status: DC | PRN
  Administered 2016-07-21: 1000 mL via INTRA_ARTERIAL

## 2016-07-21 MED ORDER — ACETAMINOPHEN 325 MG PO TABS
325.0000 mg | ORAL_TABLET | ORAL | Status: DC | PRN
  Filled 2016-07-21: qty 2

## 2016-07-21 MED ORDER — MIDAZOLAM HCL 2 MG/2ML IJ SOLN
INTRAMUSCULAR | Status: AC
  Filled 2016-07-21: qty 2

## 2016-07-21 MED ORDER — FENTANYL CITRATE (PF) 100 MCG/2ML IJ SOLN
INTRAMUSCULAR | Status: AC
  Filled 2016-07-21: qty 2

## 2016-07-21 MED ORDER — MIDAZOLAM HCL 2 MG/2ML IJ SOLN
INTRAMUSCULAR | Status: DC | PRN
  Administered 2016-07-21: 2 mg via INTRAVENOUS

## 2016-07-21 MED ORDER — MORPHINE SULFATE (PF) 10 MG/ML IV SOLN
2.0000 mg | INTRAVENOUS | Status: DC | PRN
  Administered 2016-07-21: 4 mg via INTRAVENOUS

## 2016-07-21 MED ORDER — AMLODIPINE BESYLATE 10 MG PO TABS
10.0000 mg | ORAL_TABLET | Freq: Once | ORAL | Status: AC
  Administered 2016-07-21: 10 mg via ORAL
  Filled 2016-07-21: qty 1

## 2016-07-21 MED ORDER — HYDRALAZINE HCL 20 MG/ML IJ SOLN
INTRAMUSCULAR | Status: AC
  Filled 2016-07-21: qty 1

## 2016-07-21 SURGICAL SUPPLY — 21 items
BALLN ARMADA 4X60X135 (BALLOONS) ×3
BALLN IN.PACT DCB 5X60 (BALLOONS) ×3
BALLOON ARMADA 4X60X135 (BALLOONS) IMPLANT
CATH ANGIO 5F PIGTAIL 65CM (CATHETERS) ×1 IMPLANT
CATH CROSS OVER TEMPO 5F (CATHETERS) ×1 IMPLANT
CATH STRAIGHT 5FR 65CM (CATHETERS) ×1 IMPLANT
CATH TEMPO AQUA 5F 100CM (CATHETERS) ×1 IMPLANT
COVER PRB 48X5XTLSCP FOLD TPE (BAG) IMPLANT
COVER PROBE 5X48 (BAG) ×3
DCB IN.PACT 5X60 (BALLOONS) IMPLANT
GUIDEWIRE ANGLED .035X260CM (WIRE) IMPLANT
KIT ENCORE 26 ADVANTAGE (KITS) ×1 IMPLANT
KIT PV (KITS) ×3 IMPLANT
SHEATH PINNACLE 5F 10CM (SHEATH) ×1 IMPLANT
SHEATH PINNACLE 7F 10CM (SHEATH) ×1 IMPLANT
SHEATH PINNACLE MP 7F 45CM (SHEATH) ×1 IMPLANT
SYR MEDRAD MARK V 150ML (SYRINGE) ×3 IMPLANT
TRANSDUCER W/STOPCOCK (MISCELLANEOUS) ×3 IMPLANT
TRAY PV CATH (CUSTOM PROCEDURE TRAY) ×3 IMPLANT
WIRE HITORQ VERSACORE ST 145CM (WIRE) ×1 IMPLANT
WIRE VERSACORE LOC 115CM (WIRE) IMPLANT

## 2016-07-21 NOTE — Interval H&P Note (Signed)
History and Physical Interval Note:  07/21/2016 7:33 AM  Jeffrey Barber  has presented today for surgery, with the diagnosis of gain grene right foot  The various methods of treatment have been discussed with the patient and family. After consideration of risks, benefits and other options for treatment, the patient has consented to  Procedure(s): Abdominal Aortogram w/Lower Extremity (N/A) as a surgical intervention .  The patient's history has been reviewed, patient examined, no change in status, stable for surgery.  I have reviewed the patient's chart and labs.  Questions were answered to the patient's satisfaction.     Fabienne BrunsFields, Severus Brodzinski
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
## 2016-07-21 NOTE — Progress Notes (Signed)
Inpatient Diabetes Program Recommendations  AACE/ADA: New Consensus Statement on Inpatient Glycemic Control (2015)  Target Ranges:  Prepandial:   less than 140 mg/dL      Peak postprandial:   less than 180 mg/dL (1-2 hours)      Critically ill patients:  140 - 180 mg/dL   Lab Results  Component Value Date   GLUCAP 170 (H) 07/21/2016   HGBA1C 12.7 (H) 07/20/2016    Review of Glycemic Control:  Results for [name], Jeffrey D (MRN [id number]) as of 07/21/2016 15:23  Ref. Range 07/20/2016 20:24 07/21/2016 00:01 07/21/2016 04:50 07/21/2016 09:15 07/21/2016 12:39  Glucose-Capillary Latest Ref Range: 65 - 99 mg/dL 409133 (H) 811164 (H) 914163 (H) 152 (H) 170 (H)   Diabetes history: Type 2 diabetes Outpatient Diabetes medications: Metformin 1000 mg bid, Levemir 5 units q HS (patient states he takes it PRN high blood sugars) Current orders for Inpatient glycemic control:  Novolog moderate tid with meals, Levemir 5 units daily  Inpatient Diabetes Program Recommendations:    Note that A1C indicates uncontrolled diabetes. Discussed A1C results with patient at bedside.  He states that he has been under a tremendous amount of stress recently due to the recent death of his wife from cancer.  He states that he has been drinking daily due to grief.  Patient tearful as he spoke of his wife's recent death.  Gave patient handout on "Know your Numbers" regarding A1C results, goal A1C, Standards of care and normal blood sugar levels.  May consider increasing Levemir to 10 units daily.  I explained to patient that he likely needs daily Levemir instead of prn.  He verbalized understanding.  [place]   Needs PCP close by to follow-up with regarding diabetes and diabetes medications.  Discussed importance of glucose control for healing.  Thanks, Beryl MeagerJenny Nash Bolls, RN, BC-ADM Inpatient Diabetes Coordinator Pager [phone] (8a-5p)
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
## 2016-07-21 NOTE — Op Note (Addendum)
Procedure: Abdominal aortogram with right lower extremity runoff  Preoperative diagnosis: Gangrene right foot  Postoperative diagnosis: Same  Anesthesia: Local with IV sedation  Operative findings: #1 subtotal occlusion mid right superficial femoral artery number to 30% stenosis right common femoral artery. #2 drug coated balloon angioplasty of right superficial femoral artery to 0% residual stenosis  Operative details: After obtaining informed consent, the patient was taken to the [place] lab. The patient was placed in supine position Angio table. Both groins were prepped and draped in usual sterile fashion. Local anesthesia was infiltrated over the left common femoral artery. Ultrasound was used to identify left common femoral artery. There was some posterior plaque. An introducer needle was used to cannulate the left common femoral artery under ultrasound guidance. An 035 versacore wire was then threaded up the abdominal aorta under fluoroscopic guidance. A 5 French sheath placed over the guidewire the left common femoral artery. This was thoroughly flushed with saline. A 5 French pigtail catheter was advanced over the guidewire and an abdominal aortogram was obtained in AP projection. There are 2 renal arteries on the right side. These will either patent. There is single renal artery on the left side. This widely patent. The infrarenal abdominal aorta left common right common internal and external iliac arteries are all patent. There is some calcification of the aortic bifurcation. At this point the V. tach catheter was removed over guidewire exchange for a 5 [place] crossover catheter. This was used likely catheterize the right common iliac artery and in the guidewire advanced down into the proximal right superficial femoral artery. Crossover catheter was then removed and exchanged for a 5 straight catheter. Right lower extremity arteriogram was obtained through this. The right common femoral artery is  patent although there is a 30% stenosis from posterior plaque. The profunda superficial femoral arteries are patent proximally. There is a subtotal occlusion in the midportion of the right superficial femoral artery. The above and below knee popliteal artery is widely patent. There is three-vessel runoff to the right foot. This was decided to intervene on the subtotal occlusion in the right SFA. The versacore wire was advanced to the mid right SFA. The 5 French sheath was removed over guidewire exchange for a 7 [place] Terumo sheath. This was then advanced in the proximal superficial femoral artery on the right side. The patient was given 8000 units of intravenous heparin. With the sheath support I was able to advance the versacore wire across the subtotal occlusion of the right SFA with minimal difficulty. A 4 x 6 mm angioplasty balloon was then brought up in the operative field and sent of the lesion and deployed to first 10 atm for 30 seconds and then 20 atm for 30 seconds. This gave [place] a lumen of 4.5 mm. A 5 x 6 drug coated angioplasty balloon ([employer] impact admirall). This was inflated to 10 atm for 3 minutes. The balloon was then removed over the wire. Sheath was aspirated there was a small amount of thrombus on the tip of it. I made several aspirations to make sure that all this up and cleared. Contrast angiogram was then completed on the right side showed a right superficial femoral artery was now widely patent without evidence of dissection. There was preserved 3 vessel runoff. The patient at this point had tolerated the procedure well. I pulled the sheath back and out of the left groin and replaced this with a 7 French short sheath. This was thoroughly flushed with heparinized saline. The  patient was then taken to the holding area in stable condition.  Operative management: The patient needs to be continued on Plavix and aspirin. He now has in-line flow with widely patent right superficial femoral  artery and three-vessel runoff to the right foot. Dr. Sharol Given should have adequate perfusion for whenever procedure he wishes to do on the right foot. The patient will need follow-up with [place] in 3 months time. I will arrange this.  Ruta Hinds, MD [facility] [place] Office: [phone] Pager: [phone]

## 2016-07-21 NOTE — Progress Notes (Signed)
Site area: left groin fa sheath Site Prior to Removal:  Level 0 Pressure Applied For:  20 minutes Manual:   yes Patient Status During Pull:  stable Post Pull Site:  Level  0 Post Pull Instructions Given:  yes Post Pull Pulses Present: rt dp palpable; lt BKA Dressing Applied:  yes Bedrest begins @ 0935 Comments:

## 2016-07-22 DIAGNOSIS — T25021S Burn of unspecified degree of right foot, sequela: Secondary | ICD-10-CM

## 2016-07-22 DIAGNOSIS — E1152 Type 2 diabetes mellitus with diabetic peripheral angiopathy with gangrene: Secondary | ICD-10-CM

## 2016-07-22 LAB — GLUCOSE, CAPILLARY
Glucose-Capillary: 197 mg/dL — ABNORMAL HIGH (ref 65–99)
Glucose-Capillary: 299 mg/dL — ABNORMAL HIGH (ref 65–99)
Glucose-Capillary: 273 mg/dL — ABNORMAL HIGH (ref 65–99)
Glucose-Capillary: 99 mg/dL (ref 65–99)

## 2016-07-22 LAB — CULTURE, BLOOD (ROUTINE X 2)
CULTURE: NO GROWTH
Culture: NO GROWTH

## 2016-07-22 LAB — CBC
HCT: 36 % — ABNORMAL LOW (ref 39.0–52.0)
Hemoglobin: 11.8 g/dL — ABNORMAL LOW (ref 13.0–17.0)
MCH: 28 pg (ref 26.0–34.0)
MCHC: 32.8 g/dL (ref 30.0–36.0)
MCV: 85.5 fL (ref 78.0–100.0)
PLATELETS: 320 10*3/uL (ref 150–400)
RBC: 4.21 MIL/uL — ABNORMAL LOW (ref 4.22–5.81)
RDW: 14.2 % (ref 11.5–15.5)
WBC: 9.8 10*3/uL (ref 4.0–10.5)

## 2016-07-22 LAB — BASIC METABOLIC PANEL
Anion gap: 9 (ref 5–15)
BUN: 8 mg/dL (ref 6–20)
CALCIUM: 8.6 mg/dL — AB (ref 8.9–10.3)
CO2: 26 mmol/L (ref 22–32)
CREATININE: 1.18 mg/dL (ref 0.61–1.24)
Chloride: 102 mmol/L (ref 101–111)
GFR calc Af Amer: >60 mL/min (ref >60)
GLUCOSE: 208 mg/dL — AB (ref 65–99)
Potassium: 3.8 mmol/L (ref 3.5–5.1)
Sodium: 137 mmol/L (ref 135–145)

## 2016-07-22 LAB — MAGNESIUM: MAGNESIUM: 1.6 mg/dL — AB (ref 1.7–2.4)

## 2016-07-22 LAB — PHOSPHORUS: PHOSPHORUS: 4.2 mg/dL (ref 2.5–4.6)

## 2016-07-22 MED ORDER — HYDRALAZINE HCL 50 MG PO TABS
75.0000 mg | ORAL_TABLET | Freq: Three times a day (TID) | ORAL | Status: DC
  Administered 2016-07-22 – 2016-07-23 (×3): 75 mg via ORAL
  Filled 2016-07-22 (×4): qty 1

## 2016-07-22 MED ORDER — CHLORHEXIDINE GLUCONATE 4 % EX LIQD
CUTANEOUS | Status: AC
  Administered 2016-07-23: 1
  Filled 2016-07-22: qty 15

## 2016-07-22 MED ORDER — CEFAZOLIN SODIUM-DEXTROSE 2-4 GM/100ML-% IV SOLN
2.0000 g | INTRAVENOUS | Status: AC
  Administered 2016-07-23: 2 g via INTRAVENOUS
  Filled 2016-07-22: qty 100

## 2016-07-22 MED ORDER — CHLORHEXIDINE GLUCONATE 4 % EX LIQD
60.0000 mL | Freq: Once | CUTANEOUS | Status: AC
  Administered 2016-07-23: 4 via TOPICAL

## 2016-07-22 MED ORDER — POVIDONE-IODINE 10 % EX SWAB: 2.0000 "application " | Freq: Once | CUTANEOUS | Status: DC

## 2016-07-22 MED ORDER — MAGNESIUM SULFATE 2 GM/50ML IV SOLN
2.0000 g | Freq: Once | INTRAVENOUS | Status: AC
  Administered 2016-07-22: 2 g via INTRAVENOUS
  Filled 2016-07-22: qty 50

## 2016-07-22 MED ORDER — OXYCODONE-ACETAMINOPHEN 5-325 MG PO TABS
1.0000 | ORAL_TABLET | Freq: Four times a day (QID) | ORAL | Status: DC | PRN
  Administered 2016-07-22 – 2016-07-25 (×12): 2 via ORAL
  Filled 2016-07-22 (×12): qty 2

## 2016-07-22 NOTE — Progress Notes (Addendum)
  [facility] Progress Note  Subjective  - POD #1  Right foot has "feeling back"  Objective Vitals:   07/21/16 2204 07/22/16 0530  BP: (!) 169/69 (!) 168/64  Pulse:  74  Resp:  18  Temp:  97.9 F (36.6 C)    Intake/Output Summary (Last 24 hours) at 07/22/16 1007 Last data filed at 07/22/16 0950  Gross per 24 hour  Intake              360 ml  Output             1450 ml  Net            -1090 ml   No hematoma left groin.  Palpable right DP pulse Gangrenous right 2-5th toes, ulcer with gangrenous changes to lateral right great toe   Assessment/Planning: 56 y.o. male is s/p: aortogram with right lower extremity runoff with drug coated balloon angioplasty of right superficial femoral artery 1 Day Post-Op   Operative findings: subtotal occlusion mid right SFA, 30% stenosis right common femoral artery.   Now with widely patent right SFA and three vessel runoff.  Palpable DP pulse this am.  Patient should have adequate perfusion to heal intervention by Dr. Lajoyce Cornersuda.  Continue on Plavix and ASA.  F/u with 3 months with Dr. Darrick PennaFields.   Raymond GurneyKimberly A Trinh 07/22/2016 10:07 AM --  Laboratory CBC    Component Value Date/Time   WBC 9.8 07/22/2016 0443   HGB 11.8 (L) 07/22/2016 0443   HCT 36.0 (L) 07/22/2016 0443   PLT 320 07/22/2016 0443    BMET    Component Value Date/Time   NA 137 07/22/2016 0443   K 3.8 07/22/2016 0443   CL 102 07/22/2016 0443   CO2 26 07/22/2016 0443   GLUCOSE 208 (H) 07/22/2016 0443   BUN 8 07/22/2016 0443   CREATININE 1.18 07/22/2016 0443   CALCIUM 8.6 (L) 07/22/2016 0443   GFRNONAA >60 07/22/2016 0443   GFRAA >60 07/22/2016 0443    COAG Lab Results  Component Value Date   INR 0.98 07/21/2016   INR 0.9 01/11/2009   No results found for: PTT  Antibiotics Anti-infectives    Start     Dose/Rate Route Frequency Ordered Stop   07/17/16 0400  piperacillin-tazobactam (ZOSYN) IVPB 3.375 g     3.375 g 12.5 mL/hr over 240 Minutes  Intravenous Every 8 hours 07/16/16 2021     07/17/16 0400  vancomycin (VANCOCIN) IVPB 1000 mg/200 mL premix  Status:  Discontinued     1,000 mg 200 mL/hr over 60 Minutes Intravenous Every 12 hours 07/16/16 2021 07/18/16 1915   07/16/16 1900  piperacillin-tazobactam (ZOSYN) IVPB 3.375 g     3.375 g 100 mL/hr over 30 Minutes Intravenous  Once 07/16/16 1851 07/16/16 2024   07/16/16 1900  vancomycin (VANCOCIN) IVPB 1000 mg/200 mL premix     1,000 mg 200 mL/hr over 60 Minutes Intravenous  Once 07/16/16 1851 07/16/16 2124       Maris BergerKimberly Trinh, PA-C [facility] Office: [phone] Pager: [phone] 07/22/2016 10:07 AM  I have examined the patient, reviewed and agree with above. Palp dp pulse  Gretta BeganEarly, Alexine Pilant, MD 07/22/2016 11:52 AM

## 2016-07-22 NOTE — Progress Notes (Signed)
Patient ID: Jeffrey Barber, male   DOB: 08/20/1960, 56 y.o.   MRN: [id number] Examination patient has excellent inflow after his angioplasty. He has a bounding ourselves pedis pulse. The gangrenous ulcers to toes 1234 and 5 are showing good improvement however there is complete gangrene of toes 23 and 4. We will plan for partial amputation of toes 23 and 4 tomorrow anticipate that we will not need surgery on the fourth or great toe will do some debridement of the eschar and may possibly require partial amputation of the little toe as well.
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
## 2016-07-23 ENCOUNTER — Inpatient Hospital Stay (HOSPITAL_COMMUNITY): Payer: Self-pay | Admitting: Anesthesiology

## 2016-07-23 ENCOUNTER — Encounter (HOSPITAL_COMMUNITY): Payer: Self-pay | Admitting: Certified Registered"

## 2016-07-23 ENCOUNTER — Encounter (HOSPITAL_COMMUNITY)
Admission: EM | Disposition: A | Discharge status: Home / Self Care | Payer: Self-pay | Source: Home / Self Care | Attending: Internal Medicine

## 2016-07-23 HISTORY — PX: AMPUTATION: SHX166

## 2016-07-23 LAB — GLUCOSE, CAPILLARY
GLUCOSE-CAPILLARY: 142 mg/dL — AB (ref 65–99)
Glucose-Capillary: 135 mg/dL — ABNORMAL HIGH (ref 65–99)
Glucose-Capillary: 174 mg/dL — ABNORMAL HIGH (ref 65–99)
Glucose-Capillary: 84 mg/dL (ref 65–99)
Glucose-Capillary: 152 mg/dL — ABNORMAL HIGH (ref 65–99)

## 2016-07-23 SURGERY — AMPUTATION DIGIT
Anesthesia: Monitor Anesthesia Care | Site: Leg Lower | Laterality: Right

## 2016-07-23 MED ORDER — CHLORHEXIDINE GLUCONATE 4 % EX LIQD
CUTANEOUS | Status: AC
  Administered 2016-07-23: 4
  Filled 2016-07-23: qty 15

## 2016-07-23 MED ORDER — LIDOCAINE 2% (20 MG/ML) 5 ML SYRINGE
INTRAMUSCULAR | Status: AC
  Filled 2016-07-23: qty 5

## 2016-07-23 MED ORDER — 0.9 % SODIUM CHLORIDE (POUR BTL) OPTIME
TOPICAL | Status: DC | PRN
  Administered 2016-07-23: 1000 mL

## 2016-07-23 MED ORDER — CHLORHEXIDINE GLUCONATE 4 % EX LIQD
CUTANEOUS | Status: AC
  Administered 2016-07-23: 1
  Filled 2016-07-23: qty 15

## 2016-07-23 MED ORDER — ACETAMINOPHEN 325 MG PO TABS: 650.0000 mg | ORAL_TABLET | Freq: Four times a day (QID) | ORAL | Status: DC | PRN

## 2016-07-23 MED ORDER — HYDROMORPHONE HCL 1 MG/ML IJ SOLN: 0.2500 mg | INTRAMUSCULAR | Status: DC | PRN

## 2016-07-23 MED ORDER — SODIUM CHLORIDE 0.9 % IV SOLN
INTRAVENOUS | Status: DC
  Administered 2016-07-23: 12:00:00 via INTRAVENOUS

## 2016-07-23 MED ORDER — LISINOPRIL 10 MG PO TABS
10.0000 mg | ORAL_TABLET | Freq: Every day | ORAL | Status: DC
  Administered 2016-07-23 – 2016-07-24 (×2): 10 mg via ORAL
  Filled 2016-07-23 (×3): qty 1

## 2016-07-23 MED ORDER — BUPIVACAINE-EPINEPHRINE (PF) 0.5% -1:200000 IJ SOLN
INTRAMUSCULAR | Status: DC | PRN
  Administered 2016-07-23: 30 mL via PERINEURAL

## 2016-07-23 MED ORDER — MIDAZOLAM HCL 5 MG/5ML IJ SOLN
INTRAMUSCULAR | Status: DC | PRN
  Administered 2016-07-23: 2 mg via INTRAVENOUS

## 2016-07-23 MED ORDER — CEFAZOLIN IN D5W 1 GM/50ML IV SOLN
1.0000 g | Freq: Four times a day (QID) | INTRAVENOUS | Status: AC
  Administered 2016-07-23 (×3): 1 g via INTRAVENOUS
  Filled 2016-07-23 (×3): qty 50

## 2016-07-23 MED ORDER — HYDRALAZINE HCL 50 MG PO TABS
50.0000 mg | ORAL_TABLET | Freq: Three times a day (TID) | ORAL | Status: DC
  Administered 2016-07-23 – 2016-07-25 (×7): 50 mg via ORAL
  Filled 2016-07-23 (×7): qty 1

## 2016-07-23 MED ORDER — ONDANSETRON HCL 4 MG PO TABS: 4.0000 mg | ORAL_TABLET | Freq: Four times a day (QID) | ORAL | Status: DC | PRN

## 2016-07-23 MED ORDER — FENTANYL CITRATE (PF) 100 MCG/2ML IJ SOLN
INTRAMUSCULAR | Status: DC | PRN
  Administered 2016-07-23 (×2): 50 ug via INTRAVENOUS

## 2016-07-23 MED ORDER — ROCURONIUM BROMIDE 50 MG/5ML IV SOSY
PREFILLED_SYRINGE | INTRAVENOUS | Status: AC
  Filled 2016-07-23: qty 5

## 2016-07-23 MED ORDER — PROPOFOL 10 MG/ML IV BOLUS
INTRAVENOUS | Status: AC
  Filled 2016-07-23: qty 20

## 2016-07-23 MED ORDER — METOCLOPRAMIDE HCL 5 MG/ML IJ SOLN: 5.0000 mg | Freq: Three times a day (TID) | INTRAMUSCULAR | Status: DC | PRN

## 2016-07-23 MED ORDER — SUCCINYLCHOLINE CHLORIDE 200 MG/10ML IV SOSY
PREFILLED_SYRINGE | INTRAVENOUS | Status: AC
  Filled 2016-07-23: qty 10

## 2016-07-23 MED ORDER — LACTATED RINGERS IV SOLN
INTRAVENOUS | Status: DC | PRN
  Administered 2016-07-23: 07:00:00 via INTRAVENOUS

## 2016-07-23 MED ORDER — PROMETHAZINE HCL 25 MG/ML IJ SOLN: 6.2500 mg | INTRAMUSCULAR | Status: DC | PRN

## 2016-07-23 MED ORDER — ACETAMINOPHEN 650 MG RE SUPP: 650.0000 mg | Freq: Four times a day (QID) | RECTAL | Status: DC | PRN

## 2016-07-23 MED ORDER — ONDANSETRON HCL 4 MG/2ML IJ SOLN: 4.0000 mg | Freq: Four times a day (QID) | INTRAMUSCULAR | Status: DC | PRN

## 2016-07-23 MED ORDER — PROPOFOL 500 MG/50ML IV EMUL
INTRAVENOUS | Status: DC | PRN
  Administered 2016-07-23: 75 ug/kg/min via INTRAVENOUS

## 2016-07-23 MED ORDER — HYDRALAZINE HCL 20 MG/ML IJ SOLN: 10.0000 mg | Freq: Four times a day (QID) | INTRAMUSCULAR | Status: DC | PRN

## 2016-07-23 MED ORDER — METOCLOPRAMIDE HCL 5 MG PO TABS: 5.0000 mg | ORAL_TABLET | Freq: Three times a day (TID) | ORAL | Status: DC | PRN

## 2016-07-23 MED ORDER — HYDRALAZINE HCL 50 MG PO TABS: 100.0000 mg | ORAL_TABLET | Freq: Three times a day (TID) | ORAL | Status: DC

## 2016-07-23 MED ORDER — METHOCARBAMOL 1000 MG/10ML IJ SOLN: 500.0000 mg | Freq: Four times a day (QID) | INTRAVENOUS | Status: DC | PRN

## 2016-07-23 MED ORDER — METHOCARBAMOL 500 MG PO TABS: 500.0000 mg | ORAL_TABLET | Freq: Four times a day (QID) | ORAL | Status: DC | PRN

## 2016-07-23 MED ORDER — PHENYLEPHRINE 40 MCG/ML (10ML) SYRINGE FOR IV PUSH (FOR BLOOD PRESSURE SUPPORT)
PREFILLED_SYRINGE | INTRAVENOUS | Status: AC
  Filled 2016-07-23: qty 10

## 2016-07-23 MED ORDER — EPHEDRINE 5 MG/ML INJ
INTRAVENOUS | Status: AC
  Filled 2016-07-23: qty 10

## 2016-07-23 MED ORDER — FENTANYL CITRATE (PF) 100 MCG/2ML IJ SOLN
INTRAMUSCULAR | Status: AC
  Filled 2016-07-23: qty 2

## 2016-07-23 MED ORDER — MIDAZOLAM HCL 2 MG/2ML IJ SOLN
INTRAMUSCULAR | Status: AC
  Filled 2016-07-23: qty 2

## 2016-07-23 SURGICAL SUPPLY — 29 items
BLADE SURG 21 STRL SS (BLADE) ×3 IMPLANT
BNDG CMPR 9X4 STRL LF SNTH (GAUZE/BANDAGES/DRESSINGS)
BNDG COHESIVE 4X5 TAN STRL (GAUZE/BANDAGES/DRESSINGS) ×3 IMPLANT
BNDG ESMARK 4X9 LF (GAUZE/BANDAGES/DRESSINGS) IMPLANT
BNDG GAUZE ELAST 4 BULKY (GAUZE/BANDAGES/DRESSINGS) ×3 IMPLANT
COVER SURGICAL LIGHT HANDLE (MISCELLANEOUS) ×6 IMPLANT
DRAPE U-SHAPE 47X51 STRL (DRAPES) ×3 IMPLANT
DRSG ADAPTIC 3X8 NADH LF (GAUZE/BANDAGES/DRESSINGS) ×2 IMPLANT
DRSG PAD ABDOMINAL 8X10 ST (GAUZE/BANDAGES/DRESSINGS) ×3 IMPLANT
DURAPREP 26ML APPLICATOR (WOUND CARE) ×3 IMPLANT
ELECT REM PT RETURN 9FT ADLT (ELECTROSURGICAL) ×3
ELECTRODE REM PT RTRN 9FT ADLT (ELECTROSURGICAL) ×1 IMPLANT
GAUZE SPONGE 4X4 12PLY STRL (GAUZE/BANDAGES/DRESSINGS) ×2 IMPLANT
GLOVE BIOGEL PI IND STRL 9 (GLOVE) ×1 IMPLANT
GLOVE BIOGEL PI INDICATOR 9 (GLOVE) ×2
GLOVE SURG ORTHO 9.0 STRL STRW (GLOVE) ×3 IMPLANT
GOWN STRL REUS W/ TWL XL LVL3 (GOWN DISPOSABLE) ×2 IMPLANT
GOWN STRL REUS W/TWL XL LVL3 (GOWN DISPOSABLE) ×6
KIT BASIN OR (CUSTOM PROCEDURE TRAY) ×3 IMPLANT
KIT ROOM TURNOVER OR (KITS) ×3 IMPLANT
MANIFOLD NEPTUNE II (INSTRUMENTS) ×1 IMPLANT
NEEDLE 22X1 1/2 (OR ONLY) (NEEDLE) IMPLANT
NS IRRIG 1000ML POUR BTL (IV SOLUTION) ×3 IMPLANT
PACK ORTHO EXTREMITY (CUSTOM PROCEDURE TRAY) ×3 IMPLANT
PAD ABD 8X10 STRL (GAUZE/BANDAGES/DRESSINGS) ×2 IMPLANT
PAD ARMBOARD 7.5X6 YLW CONV (MISCELLANEOUS) ×6 IMPLANT
SUT ETHILON 2 0 PSLX (SUTURE) ×3 IMPLANT
SYR CONTROL 10ML LL (SYRINGE) IMPLANT
TOWEL OR 17X26 10 PK STRL BLUE (TOWEL DISPOSABLE) ×3 IMPLANT

## 2016-07-23 NOTE — Op Note (Signed)
07/16/2016 - 07/23/2016  8:03 AM  PATIENT:  Marcelle OverlieGlenn D Mangrum    PRE-OPERATIVE DIAGNOSIS:  gangrene right toes  POST-OPERATIVE DIAGNOSIS:  Same  PROCEDURE:  AMPUTATION  OF TOES  2,3,4 right foot  SURGEON:  Nadara MustardMarcus V Duda, MD  PHYSICIAN ASSISTANT:None ANESTHESIA:   General  PREOPERATIVE INDICATIONS:  Marcelle OverlieGlenn D Miao is a  56 y.o. male with a diagnosis of gangrene right toes who failed conservative measures and elected for surgical management.    The risks benefits and alternatives were discussed with the patient preoperatively including but not limited to the risks of infection, bleeding, nerve injury, cardiopulmonary complications, the need for revision surgery, among others, and the patient was willing to proceed.  OPERATIVE IMPLANTS: None  OPERATIVE FINDINGS: Good petechial bleeding on toes 23 and 4. Eschar debrided from great toe and little toe and also had good granulation tissue.  OPERATIVE PROCEDURE: Patient brought to operating room after undergoing a popliteal block. After adequate levels anesthesia obtained patient's right lower extremity was prepped using DuraPrep draped into a sterile field a timeout was called. A fishmouth incision was made just proximal to the dry gangrenous changes of toes 23 and 4. The toes were resected through the proximal phalanx of each toe. Electrocautery was used for hemostasis the wounds were irrigated with normal saline incisions were closed using 2-0 nylon for toes 23 and 4. A sterile compressive dressing was applied. Patient was taken to PACU in stable condition.  Plan for physical therapy touchdown weightbearing on the right patient may be discharged to home when he is safe with therapy.

## 2016-07-23 NOTE — Progress Notes (Signed)
Orthopedic Tech Progress Note Patient Details:  Jeffrey OverlieGlenn D Barber Jan 09, 1961 [id number]  Ortho Devices Type of Ortho Device: Postop shoe/boot Ortho Device/Splint Location: rle Ortho Device/Splint Interventions: Application   Jeffrey Barber 07/23/2016, 11:30 AM

## 2016-07-23 NOTE — Anesthesia Procedure Notes (Addendum)
Anesthesia Regional Block:  Popliteal block  Pre-Anesthetic Checklist: ,, timeout performed, Correct Patient, Correct Site, Correct Laterality, Correct Procedure, Correct Position, site marked, Risks and benefits discussed,  Surgical consent,  Pre-op evaluation,  At surgeon's request and post-op pain management  Laterality: Right  Prep: chloraprep       Needles:  Injection technique: Single-shot  Needle Type: Echogenic Stimulator Needle     Needle Length:cm 9 cm Needle Gauge: 21 G    Additional Needles:  Procedures: ultrasound guided (picture in chart) and nerve stimulator Popliteal block  Nerve Stimulator or Paresthesia:  Response: plantar flexion, 0.45 mA,   Additional Responses:   Narrative:  Start time: 07/23/2016 7:11 AM End time: 07/23/2016 7:21 AM Injection made incrementally with aspirations every 5 mL.  Performed by: Personally  Anesthesiologist: Heather RobertsSINGER, Lailani Tool  Additional Notes: A functioning IV was confirmed and monitors were applied.  Sterile prep and drape, hand hygiene and sterile gloves were used.  Negative aspiration and test dose prior to incremental administration of local anesthetic. The patient tolerated the procedure well.Ultrasound  guidance: relevant anatomy identified, needle position confirmed, local anesthetic spread visualized around nerve(s), vascular puncture avoided.  Image printed for medical record.

## 2016-07-23 NOTE — Transfer of Care (Signed)
Immediate Anesthesia Transfer of Care Note  Patient: ABDIRIZAK RICHISON  Procedure(s) Performed: Procedure(s): AMPUTATION  OF TOES  2,3,4 (Right)  Patient Location: PACU  Anesthesia Type:MAC combined with regional for post-op pain  Level of Consciousness: awake, alert , oriented and patient cooperative  Airway & Oxygen Therapy: Patient Spontanous Breathing and Patient connected to nasal cannula oxygen  Post-op Assessment: Report given to RN and Post -op Vital signs reviewed and stable  Post vital signs: Reviewed and stable  Last Vitals:  Vitals:   07/22/16 2118 07/23/16 0803  BP: (!) 190/70   Pulse: 81   Resp: 18   Temp: 37.2 C 36.6 C    Last Pain:  Vitals:   07/23/16 0621  TempSrc:   PainSc: 0-No pain      Patients Stated Pain Goal: 0 (11/55/20 8022)  Complications: No apparent anesthesia complications
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
## 2016-07-23 NOTE — H&P (View-Only) (Signed)
Patient ID: Jeffrey Barber, male   DOB: 08/11/1960, 55 y.o.   MRN: [id number] Examination patient has excellent inflow after his angioplasty. He has a bounding ourselves pedis pulse. The gangrenous ulcers to toes 1234 and 5 are showing good improvement however there is complete gangrene of toes 23 and 4. We will plan for partial amputation of toes 23 and 4 tomorrow anticipate that we will not need surgery on the fourth or great toe will do some debridement of the eschar and may possibly require partial amputation of the little toe as well. 

## 2016-07-23 NOTE — Anesthesia Preprocedure Evaluation (Addendum)
Anesthesia Evaluation  Patient identified by MRN, date of birth, ID band Patient awake    Reviewed: Allergy & Precautions, NPO status , Patient's Chart, lab work & pertinent test results  History of Anesthesia Complications Negative for: history of anesthetic complications  Airway Mallampati: I  TM Distance: >3 FB Neck ROM: Full    Dental no notable dental hx. (+) Dental Advisory Given, Edentulous Upper, Edentulous Lower   Pulmonary neg pulmonary ROS, former smoker,    Pulmonary exam normal        Cardiovascular hypertension, Pt. on medications negative cardio ROS Normal cardiovascular exam     Neuro/Psych TIAnegative psych ROS   GI/Hepatic negative GI ROS, Neg liver ROS, GERD  ,  Endo/Other  negative endocrine ROSdiabetes  Renal/GU negative Renal ROS  negative genitourinary   Musculoskeletal negative musculoskeletal ROS (+)   Abdominal   Peds negative pediatric ROS (+)  Hematology negative hematology ROS (+)   Anesthesia Other Findings   Reproductive/Obstetrics negative OB ROS                             Anesthesia Physical Anesthesia Plan  ASA: III  Anesthesia Plan: MAC and Regional   Post-op Pain Management:  Regional for Post-op pain   Induction: Intravenous  Airway Management Planned: Natural Airway and Simple Face Mask  Additional Equipment:   Intra-op Plan:   Post-operative Plan: Extubation in OR  Informed Consent: I have reviewed the patients History and Physical, chart, labs and discussed the procedure including the risks, benefits and alternatives for the proposed anesthesia with the patient or authorized representative who has indicated his/her understanding and acceptance.   Dental advisory given  Plan Discussed with: CRNA, Anesthesiologist and Surgeon  Anesthesia Plan Comments:       Anesthesia Quick Evaluation

## 2016-07-23 NOTE — Interval H&P Note (Signed)
History and Physical Interval Note:  07/23/2016 6:30 AM  Jeffrey Barber  has presented today for surgery, with the diagnosis of gangrene right toes  The various methods of treatment have been discussed with the patient and family. After consideration of risks, benefits and other options for treatment, the patient has consented to  Procedure(s): AMPUTATION  OF TOES  2,3,4 (Right) as a surgical intervention .  The patient's history has been reviewed, patient examined, no change in status, stable for surgery.  I have reviewed the patient's chart and labs.  Questions were answered to the patient's satisfaction.     Nadara MustardMarcus V Nary Sneed

## 2016-07-23 NOTE — Anesthesia Postprocedure Evaluation (Addendum)
Anesthesia Post Note  Patient: Jeffrey Barber  Procedure(s) Performed: Procedure(s) (LRB): AMPUTATION  OF TOES  2,3,4 (Right)  Patient location during evaluation: PACU Anesthesia Type: Regional Level of consciousness: awake and alert Pain management: pain level controlled Vital Signs Assessment: post-procedure vital signs reviewed and stable Respiratory status: spontaneous breathing and respiratory function stable Cardiovascular status: stable Anesthetic complications: no       Last Vitals:  Vitals:   07/23/16 0815 07/23/16 0829  BP: (!) 165/63 (!) 166/68  Pulse: 77 80  Resp: 12 15  Temp:  36.8 C    Last Pain:  Vitals:   07/23/16 0829  TempSrc:   PainSc: 0-No pain                 Raneshia Derick DANIEL

## 2016-07-24 ENCOUNTER — Encounter (HOSPITAL_COMMUNITY): Payer: Self-pay | Admitting: Orthopedic Surgery

## 2016-07-24 DIAGNOSIS — D72829 Elevated white blood cell count, unspecified: Secondary | ICD-10-CM

## 2016-07-24 LAB — COMPREHENSIVE METABOLIC PANEL
ALK PHOS: 88 U/L (ref 38–126)
ALT: 8 U/L — AB (ref 17–63)
AST: 22 U/L (ref 15–41)
Albumin: 2.2 g/dL — ABNORMAL LOW (ref 3.5–5.0)
Anion gap: 11 (ref 5–15)
BUN: 10 mg/dL (ref 6–20)
CALCIUM: 9 mg/dL (ref 8.9–10.3)
CO2: 26 mmol/L (ref 22–32)
CREATININE: 1.19 mg/dL (ref 0.61–1.24)
Chloride: 101 mmol/L (ref 101–111)
GFR calc non Af Amer: >60 mL/min (ref >60)
GLUCOSE: 93 mg/dL (ref 65–99)
Potassium: 3.6 mmol/L (ref 3.5–5.1)
SODIUM: 138 mmol/L (ref 135–145)
Total Bilirubin: 0.6 mg/dL (ref 0.3–1.2)
Total Protein: 6.4 g/dL — ABNORMAL LOW (ref 6.5–8.1)

## 2016-07-24 LAB — CBC WITH DIFFERENTIAL/PLATELET
Basophils Absolute: 0.1 10*3/uL (ref 0.0–0.1)
Basophils Relative: 1 %
EOS ABS: 0.4 10*3/uL (ref 0.0–0.7)
Eosinophils Relative: 4 %
HCT: 35.1 % — ABNORMAL LOW (ref 39.0–52.0)
HEMOGLOBIN: 11.4 g/dL — AB (ref 13.0–17.0)
LYMPHS ABS: 2.6 10*3/uL (ref 0.7–4.0)
LYMPHS PCT: 21 %
MCH: 27.5 pg (ref 26.0–34.0)
MCHC: 32.5 g/dL (ref 30.0–36.0)
MCV: 84.8 fL (ref 78.0–100.0)
Monocytes Absolute: 1.1 10*3/uL — ABNORMAL HIGH (ref 0.1–1.0)
Monocytes Relative: 9 %
NEUTROS PCT: 65 %
Neutro Abs: 8 10*3/uL — ABNORMAL HIGH (ref 1.7–7.7)
Platelets: 370 10*3/uL (ref 150–400)
RBC: 4.14 MIL/uL — AB (ref 4.22–5.81)
RDW: 13.9 % (ref 11.5–15.5)
WBC: 12.1 10*3/uL — AB (ref 4.0–10.5)

## 2016-07-24 LAB — GLUCOSE, CAPILLARY
GLUCOSE-CAPILLARY: 173 mg/dL — AB (ref 65–99)
Glucose-Capillary: 96 mg/dL (ref 65–99)
Glucose-Capillary: 143 mg/dL — ABNORMAL HIGH (ref 65–99)
Glucose-Capillary: 124 mg/dL — ABNORMAL HIGH (ref 65–99)

## 2016-07-24 LAB — PHOSPHORUS: PHOSPHORUS: 5.1 mg/dL — AB (ref 2.5–4.6)

## 2016-07-24 LAB — MAGNESIUM: Magnesium: 2 mg/dL (ref 1.7–2.4)

## 2016-07-24 MED FILL — Morphine Sulfate Inj 4 MG/ML: INTRAMUSCULAR | Qty: 1 | Status: AC

## 2016-07-24 NOTE — Evaluation (Signed)
Occupational Therapy Evaluation Patient Details Name: Jeffrey Barber MRN: [id number] DOB: Jul 14, 1960 Today's Date: 07/24/2016    History of Present Illness Pt is a 56 y/o male s/p amputation of toes 2, 3 and 4 right foot. Orders in chart state "Minimize WB R foot" TDWB R LE   Clinical Impression   Pt admitted as above. He currently has deficits in his ability to perform ADL's and functional mobility and should benefit from acute OT to assist with increased independence (See OT problem list below). Pt is currently unable to maintain RLE TDWB during static stand and SPT to 3:1. He is used to leading w/ RLE per his report & still w/ numbness following surgery.- he is open to [facility] at his sisters house prior to going home to [facility]. Does not have DME in [place] (all is in [facility]) except quad cane.    Follow Up Recommendations  Home health OT;Supervision - Intermittent    Equipment Recommendations  Other (comment) (SW 2* RLE TDWB (vs 4 wheeled RW) - defer to PT)    Recommendations for Other Services       Precautions / Restrictions Precautions Precautions: Fall Restrictions Weight Bearing Restrictions: Yes RLE Weight Bearing: Touchdown weight bearing Other Position/Activity Restrictions: Limit WB; TDWB only RLE      Mobility Bed Mobility Overal bed mobility: Modified Independent             General bed mobility comments: Pt was Mod I getting into and out of bed. Increased time  Transfers Overall transfer level: Needs assistance Equipment used: Rolling walker (2 wheeled) (Uses 4 wheeled walker at home  ) Transfers: Sit to/from [employer] Sit to Stand: Min assist;From elevated surface Stand pivot transfers: Min assist;From elevated surface       General transfer comment: VC's for TDWB, pt unable to maintain during static stand  and stand pivot transfer even w/ max VC's as pt usually WB through RLE. Pt also c/o RLE numbness following surgery 07/23/16    Balance  Overall balance assessment: Needs assistance Sitting-balance support: Feet supported;No upper extremity supported Sitting balance-Leahy Scale: Good     Standing balance support: Bilateral upper extremity supported Standing balance-Leahy Scale: Poor Standing balance comment: Pt unable to maintain TDWB during toilet transfer and static standing at EOB, w/ max vc's. Pt leads w/ RLE. Max VC's required.                             ADL Overall ADL's : Needs assistance/impaired     Grooming: Wash/dry hands;Wash/dry face;Oral care;Applying deodorant;Sitting;Set up   Upper Body Bathing: Set up;Sitting   Lower Body Bathing: Set up;Sitting/lateral leans;Minimal assistance;Sit to/from stand Lower Body Bathing Details (indicate cue type and reason): Supervision for sit-lateral leans. Min A for sit-stand Upper Body Dressing : Set up;Sitting   Lower Body Dressing: Set up;Bed level   Toilet Transfer: Minimal assistance;BSC;RW;Cueing for safety Toilet Transfer Details (indicate cue type and reason): SPT, pt unable to maintain TDWB RLE even with Max VC's and demonstration. Toileting- Clothing Manipulation and Hygiene: Minimal assistance;Sit to/from stand;Cueing for safety     Tub/Shower Transfer Details (indicate cue type and reason): Pt plans to sponge bathe at sisters house at d/c, then will go home where he has DME Functional mobility during ADLs: Minimal assistance;Cueing for safety;Cueing for sequencing;Rolling walker (Pt unable to maintain RLE TDWB. He is used to leading w/ RLE per his report. Still w/ numbness following  surgery) General ADL Comments: Pt was seen for OT assessment followed by ADL retraining session with focus on bed mobility, transfers, toileting, UB/LB dressing and bathing. Pt is currently unable to maintain RLE TDWB during static stand and SPT to 3:1 - [facility] Does not have DME in [place] (all is in [facility]) except quad  cane.     Vision  Wears glasses for reading. No change from baseline   Perception     Praxis      Pertinent Vitals/Pain Pain Assessment: 0-10 Pain Score: 7  Pain Descriptors / Indicators: Aching;Sore;Tender;Other (Comment) (RLE numbness) Pain Intervention(s): Limited activity within patient's tolerance;Monitored during session;Repositioned;RN gave pain meds during session     Hand Dominance Right   Extremity/Trunk Assessment Upper Extremity Assessment Upper Extremity Assessment: Overall WFL for tasks assessed   Lower Extremity Assessment Lower Extremity Assessment: Defer to PT evaluation       Communication Communication Communication: No difficulties   Cognition Arousal/Alertness: Awake/alert Behavior During Therapy: WFL for tasks assessed/performed Overall Cognitive Status: Within Functional Limits for tasks assessed                     General Comments       Exercises       Shoulder Instructions      Home Living Family/patient expects to be discharged to:: Private residence Living Arrangements: Alone (Going to sisters house at d/c) Available Help at Discharge: Family Type of Home: Mobile home Home Access: Stairs to enter Entrance Stairs-Number of Steps: 2 STE   pt goes up sisters steps sideways and uses one rail. Entrance Stairs-Rails: Can reach both Home Layout: One level     Bathroom Shower/Tub: Tub/shower unit (Plans to sponge bathe initially)   Bathroom Toilet: Standard     Home Equipment: Walker - 4 wheels;Cane - quad;Bedside commode;Shower seat;Grab bars - tub/shower;Grab bars - toilet;Hand held shower head   Additional Comments: Has this equipment at home [name] is staying at his sisters house and only has his Quad cane ----------------------------------------------------------------------------------------------------------------------------------------------------0 (Plans to sponge bathe only at sisters house.)      Prior  Functioning/Environment Level of Independence: Independent with assistive device(s)        Comments: [place] Will be staying at his sisters house here at d/c. She does not have DME. Has Quad cane here and plans to sponge bathe initially        OT Problem List: Decreased strength;Impaired balance (sitting and/or standing);Pain;Decreased knowledge of use of DME or AE;Decreased activity tolerance   OT Treatment/Interventions: Self-care/ADL training;DME and/or AE instruction;Therapeutic activities;Patient/family education;Balance training    OT Goals(Current goals can be found in the care plan section) Acute Rehab OT Goals Patient Stated Goal: Go to sister house and then home as soon as I can. "Get back to my dogs" OT Goal Formulation: With patient Time For Goal Achievement: 07/31/16 Potential to Achieve Goals: Good  OT Frequency: Min 2X/week   Barriers to D/C: Other (comment) (Currently staying at his sisters house in [place], lives in [place] where he has DME)          Co-evaluation              End of Session Equipment Utilized During Treatment: Gait belt;Rolling walker;Other (comment) (Attempted to use 4 wheeled RW (that is on floor) first, but all 4 wheels do not lock. Pt has 4  wheeled walkers at home. May benefit from SW due to TDWB RLE.) Nurse Communication: Mobility status;Patient requests pain meds;Other (comment) (Pt unable to maintain TDWB with functional transfers today. Also reporting RLE numbness, most likely medication related.)  Activity Tolerance: Patient tolerated treatment well Patient left: in bed;with call bell/phone within reach;with bed alarm set   Time: 0981-1914 OT Time Calculation (min): 64 min Charges:  OT General Charges $OT Visit: 1 Procedure OT Evaluation $OT Eval Moderate Complexity: 1 Procedure OT Treatments $Self Care/Home Management : 38-52 mins G-Codes:    Alm Bustard, OTR/L 07/24/2016, 10:32 AM
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
## 2016-07-24 NOTE — Evaluation (Signed)
Physical Therapy Evaluation Patient Details Name: Jeffrey Barber MRN: [id number] DOB: [birth year] Today's Date: 07/24/2016   History of Present Illness  Pt is a 56 y/o male s/p amputation of toes 2, 3 and 4 right foot. Orders in chart state "Minimize WB R foot" TDWB R LE  Clinical Impression  Pt with noted depressed mood. Pt's wife passed away a month ago. Pt agitated about therapy coming in the day after surgery. Pt educated on roll of PT. Pt reports "i can't afford a PT to come out to the house so why would I have it now." Pt educated the importance of learning how to safely transfer while in hospital due to R LE now TTWB and won't be able to transfer and negotiate stairs as he normally does. Pt adamantly refused stating "the answer is no." Pt reports " I can't believe I had surgery yesterday and I have to do all this shit today." Pt does have all equip needed however has poor awareness of the importance of adhering to R LE TTWB. Recommend from psych consult to address depression. Acute PT to attempt to follow.    Follow Up Recommendations Home health PT;Supervision/Assistance - 24 hour    Equipment Recommendations  None recommended by PT    Recommendations for Other Services       Precautions / Restrictions Precautions Precautions: Fall Restrictions Weight Bearing Restrictions: Yes RLE Weight Bearing: Touchdown weight bearing Other Position/Activity Restrictions: Limit WB; TDWB only RLE      Mobility  Bed Mobility Overal bed mobility: Modified Independent             General bed mobility comments: HOB elevated, used bed rail, impulsively sat up EOB and quickly returned to supine stating "the answer is no today"  Transfers                 General transfer comment: pt adamantly refused OOB mobility due to pain and just receiving morphine and "things are still foggy"  Ambulation/Gait                Stairs Stairs:  (discussed stair negotiation as pt now R LE  TWB)          Wheelchair Mobility    Modified Rankin (Stroke Patients Only)       Balance Overall balance assessment: Needs assistance Sitting-balance support: Feet supported;No upper extremity supported Sitting balance-Leahy Scale: Good                                       Pertinent Vitals/Pain Pain Assessment: 0-10 Pain Score: 10-Worst pain ever Pain Descriptors / Indicators: Aching;Sore;Tender;Other (Comment) (RLE numbness) Pain Intervention(s):  (pt refused OOB mobility)    Home Living Family/patient expects to be discharged to:: Private residence Living Arrangements: Alone (Going to sisters house at d/c) Available Help at Discharge: Family Type of Home: Mobile home Home Access: Stairs to enter Entrance Stairs-Rails: Can reach both Entrance Stairs-Number of Steps: 2 STE   pt goes up sisters steps sideways and uses one rail. Home Layout: One level Home Equipment: Walker - 4 wheels;Cane - quad;Bedside commode;Shower seat;Grab bars - tub/shower;Grab bars - toilet;Hand held shower head Additional Comments: Has this equipment at home [name], [facility]) is staying at his sisters house and only has his Quad cane ----------------------------------------------------------------------------------------------------------------------------------------------------0 (Plans to sponge bathe only at sisters house.)    Prior Function Level of Independence: Independent with assistive  device(s)         Comments: [place] Will be staying at his sisters house here at d/c. She does not have DME. Has Quad cane here and plans to sponge bathe initially     Hand Dominance   Dominant Hand: Right    Extremity/Trunk Assessment   Upper Extremity Assessment Upper Extremity Assessment: Overall WFL for tasks assessed    Lower Extremity Assessment Lower Extremity Assessment:  (pt able to bring LEs in/out of bed, but refused WBing)        Communication   Communication: No difficulties  Cognition Arousal/Alertness: Awake/alert Behavior During Therapy: WFL for tasks assessed/performed Overall Cognitive Status: Within Functional Limits for tasks assessed                      General Comments General comments (skin integrity, edema, etc.): R foot with intact bandage    Exercises     Assessment/Plan    PT Assessment Patient needs continued PT services  PT Problem List Decreased strength;Decreased range of motion;Decreased activity tolerance;Decreased balance;Decreased mobility;Decreased coordination;Decreased cognition;Decreased safety awareness;Pain          PT Treatment Interventions DME instruction;Gait training;Stair training;Functional mobility training;Therapeutic activities;Therapeutic exercise;Balance training    PT Goals (Current goals can be found in the Care Plan section)  Acute Rehab PT Goals Patient Stated Goal: Go to sister house and then home as soon as I can. "Get back to my dogs" PT Goal Formulation: With patient Time For Goal Achievement: 07/31/16 Potential to Achieve Goals: Good    Frequency Min 3X/week   Barriers to discharge Decreased caregiver support reports pt going home to sisters for a couple of days however she lives in a trailer and has 2 steps to get in    Co-evaluation               End of Session   Activity Tolerance: Patient limited by pain Patient left: in bed;with call bell/phone within reach Nurse Communication: Mobility status (refusal of OOB and noted depression)         Time: [id number] PT Time Calculation (min) (ACUTE ONLY): 11 min   Charges:   PT Evaluation $PT Eval Low Complexity: 1 Procedure     PT G Codes:        [name] 07/24/2016, 3:39 PM   [name], PT, DPT Pager #: [phone] Office #: [phone]

## 2016-07-24 NOTE — Progress Notes (Signed)
Patient ID: Jeffrey Barber, male   DOB: 09-Sep-1960, 56 y.o.   MRN: [id number] Postoperative day 1 amputation of toes 23 and 4 right foot. Dressing is clean and dry patient has no complaints. Patient may begin physical therapy minimize weightbearing on the right foot. Patient feels like he needs a few days of hospitalization since he has no help at home. I will follow-up in the office in 2 weeks.

## 2016-07-24 NOTE — Care Management Note (Signed)
Case Management Note Donn PieriniKristi Daquann Merriott RN, BSN Unit 2W-Case Manager [phone]  Patient Details  Name: Marcelle OverlieGlenn D Scheck MRN: [id number] Date of Birth: February 08, 1961  Subjective/Objective:   Pt admitted with cellulitis and gangrene of toes, s/p 2,3,4 toe amputation on 2/4                 Action/Plan: PTA pt lived at home alone (wife recently passed)- [facility]- spoke with pt at bedside- [facility]- depends on when he can f/u with doctor. Pt states [place]- here he has a quad cane, and his w/c- he also states that he has a rollator that is at his mom's that he could use. Since his wife died he has no insurance- states he can not afford HH services- out of pocket cost. Pt is going to have his sister bring his w/c to hospital so that pt can work with therapy to make sure he can safely transfer prior to discharge. CM will f/u for any further d/c needs in am.   Expected Discharge Date:     07/25/16             Expected Discharge Plan:  Home/Self Care  In-House Referral:     Discharge planning Services  CM Consult  Post Acute Care Choice:    Choice offered to:     DME Arranged:    DME Agency:     HH Arranged:    HH Agency:     Status of Service:  In process, will continue to follow  If discussed at Long Length of Stay Meetings, dates discussed:    Additional Comments:  Darrold SpanWebster, Maleigha Colvard Hall, RN 07/24/2016, 4:30 PM

## 2016-07-25 ENCOUNTER — Other Ambulatory Visit: Payer: Self-pay | Admitting: *Deleted

## 2016-07-25 ENCOUNTER — Telehealth (INDEPENDENT_AMBULATORY_CARE_PROVIDER_SITE_OTHER): Payer: Self-pay | Admitting: Radiology

## 2016-07-25 ENCOUNTER — Telehealth: Payer: Self-pay | Admitting: Vascular Surgery

## 2016-07-25 DIAGNOSIS — Z9862 Peripheral vascular angioplasty status: Secondary | ICD-10-CM

## 2016-07-25 DIAGNOSIS — Z89421 Acquired absence of other right toe(s): Secondary | ICD-10-CM

## 2016-07-25 DIAGNOSIS — I7025 Atherosclerosis of native arteries of other extremities with ulceration: Secondary | ICD-10-CM

## 2016-07-25 LAB — CBC WITH DIFFERENTIAL/PLATELET
Basophils Absolute: 0.1 10*3/uL (ref 0.0–0.1)
Basophils Relative: 1 %
Eosinophils Absolute: 0.3 10*3/uL (ref 0.0–0.7)
Eosinophils Relative: 2 %
HCT: 34.2 % — ABNORMAL LOW (ref 39.0–52.0)
Hemoglobin: 11.3 g/dL — ABNORMAL LOW (ref 13.0–17.0)
Lymphocytes Relative: 21 %
Lymphs Abs: 2.3 10*3/uL (ref 0.7–4.0)
MCH: 27.8 pg (ref 26.0–34.0)
MCHC: 33 g/dL (ref 30.0–36.0)
MCV: 84.2 fL (ref 78.0–100.0)
Monocytes Absolute: 0.8 10*3/uL (ref 0.1–1.0)
Monocytes Relative: 7 %
Neutro Abs: 7.7 10*3/uL (ref 1.7–7.7)
Neutrophils Relative %: 69 %
Platelets: 365 10*3/uL (ref 150–400)
RBC: 4.06 MIL/uL — ABNORMAL LOW (ref 4.22–5.81)
RDW: 13.8 % (ref 11.5–15.5)
WBC: 11.2 10*3/uL — ABNORMAL HIGH (ref 4.0–10.5)

## 2016-07-25 LAB — COMPREHENSIVE METABOLIC PANEL WITH GFR
ALT: <5 U/L — ABNORMAL LOW (ref 17–63)
AST: 28 U/L (ref 15–41)
Albumin: 2 g/dL — ABNORMAL LOW (ref 3.5–5.0)
Alkaline Phosphatase: 84 U/L (ref 38–126)
Anion gap: 13 (ref 5–15)
BUN: 14 mg/dL (ref 6–20)
CO2: 24 mmol/L (ref 22–32)
Calcium: 8.6 mg/dL — ABNORMAL LOW (ref 8.9–10.3)
Chloride: 99 mmol/L — ABNORMAL LOW (ref 101–111)
Creatinine, Ser: 1.21 mg/dL (ref 0.61–1.24)
GFR calc Af Amer: >60 mL/min
GFR calc non Af Amer: >60 mL/min
Glucose, Bld: 154 mg/dL — ABNORMAL HIGH (ref 65–99)
Potassium: 4 mmol/L (ref 3.5–5.1)
Sodium: 136 mmol/L (ref 135–145)
Total Bilirubin: 1 mg/dL (ref 0.3–1.2)
Total Protein: 5.5 g/dL — ABNORMAL LOW (ref 6.5–8.1)

## 2016-07-25 LAB — GLUCOSE, CAPILLARY
GLUCOSE-CAPILLARY: 126 mg/dL — AB (ref 65–99)
GLUCOSE-CAPILLARY: 194 mg/dL — AB (ref 65–99)
Glucose-Capillary: 118 mg/dL — ABNORMAL HIGH (ref 65–99)

## 2016-07-25 LAB — PHOSPHORUS: Phosphorus: 4.5 mg/dL (ref 2.5–4.6)

## 2016-07-25 LAB — MAGNESIUM: MAGNESIUM: 1.8 mg/dL (ref 1.7–2.4)

## 2016-07-25 MED ORDER — GLUCERNA SHAKE PO LIQD: 237.0000 mL | Freq: Two times a day (BID) | ORAL | 0 refills | Status: AC

## 2016-07-25 MED ORDER — INSULIN DETEMIR 100 UNIT/ML ~~LOC~~ SOLN: 10.0000 [IU] | Freq: Every day | SUBCUTANEOUS | 11 refills | Status: AC

## 2016-07-25 MED ORDER — HYDRALAZINE HCL 50 MG PO TABS: 50.0000 mg | ORAL_TABLET | Freq: Three times a day (TID) | ORAL | 0 refills | Status: AC

## 2016-07-25 MED ORDER — GUAIFENESIN-DM 100-10 MG/5ML PO SYRP: 15.0000 mL | ORAL_SOLUTION | ORAL | 0 refills | Status: AC | PRN

## 2016-07-25 MED ORDER — ASPIRIN 81 MG PO TBEC: 81.0000 mg | DELAYED_RELEASE_TABLET | Freq: Every day | ORAL | 0 refills | Status: AC

## 2016-07-25 MED ORDER — PANTOPRAZOLE SODIUM 40 MG PO TBEC: 40.0000 mg | DELAYED_RELEASE_TABLET | Freq: Every day | ORAL | 0 refills | Status: AC

## 2016-07-25 MED ORDER — DOCUSATE SODIUM 100 MG PO CAPS: 100.0000 mg | ORAL_CAPSULE | Freq: Every day | ORAL | 0 refills | Status: AC

## 2016-07-25 MED ORDER — LISINOPRIL 10 MG PO TABS
20.0000 mg | ORAL_TABLET | Freq: Every day | ORAL | Status: DC
  Administered 2016-07-25: 20 mg via ORAL
  Filled 2016-07-25: qty 2

## 2016-07-25 MED ORDER — PRAVASTATIN SODIUM 20 MG PO TABS: 20.0000 mg | ORAL_TABLET | Freq: Every day | ORAL | 0 refills | Status: AC

## 2016-07-25 MED ORDER — OXYCODONE-ACETAMINOPHEN 10-325 MG PO TABS: 1.0000 | ORAL_TABLET | Freq: Four times a day (QID) | ORAL | 0 refills | Status: AC | PRN

## 2016-07-25 MED ORDER — AMLODIPINE BESYLATE 10 MG PO TABS: 10.0000 mg | ORAL_TABLET | Freq: Every day | ORAL | 0 refills | Status: AC

## 2016-07-25 MED ORDER — PRO-STAT SUGAR FREE PO LIQD: 30.0000 mL | Freq: Every day | ORAL | 0 refills | Status: AC

## 2016-07-25 MED ORDER — LISINOPRIL 20 MG PO TABS: 20.0000 mg | ORAL_TABLET | Freq: Every day | ORAL | 0 refills | Status: AC

## 2016-07-25 NOTE — Progress Notes (Signed)
Physical Therapy Treatment Patient Details Name: Jeffrey Barber MRN: [id number] DOB: [birth year] Today's Date: 07/25/2016    History of Present Illness Pt is a 56 y/o male s/p amputation of toes 2, 3 and 4 right foot. Orders in chart state "Minimize WB R foot" TDWB R LE    PT Comments    Pt much more cooperative this date. Pt now with R Darco shoe for forefoot off loading. Educated pt to use w/c primarily since his home is w/c accessible and to greatly limit ambulation and R LE WBing. Pt reports "I'm going to a hotel so I have a handicapped room and not have to deal with the craziness at my sisters house". Pt with verbal understanding of importance of minimizing weightbearing on R LE. Acute PT to monitor pt while in hospital.  Follow Up Recommendations  No PT follow up;Supervision - Intermittent     Equipment Recommendations  None recommended by PT    Recommendations for Other Services       Precautions / Restrictions Precautions Precautions: Fall Required Braces or Orthoses: Other Brace/Splint Other Brace/Splint: darco shoe on R Restrictions Weight Bearing Restrictions: Yes RLE Weight Bearing: Touchdown weight bearing Other Position/Activity Restrictions: limit WBIng on R LE, use darco shoe when up    Mobility  Bed Mobility Overal bed mobility: Modified Independent             General bed mobility comments: HOB elevated, used bed rail, impulsively sat up EOB and quickly returned to supine stating "the answer is no today"  Transfers Overall transfer level: Modified independent Equipment used: 4-wheeled walker Transfers: Sit to/from Stand Sit to Stand: Modified independent (Device/Increase time)         General transfer comment: v/c's to push up from chair, however pt didn't require any assist  Ambulation/Gait Ambulation/Gait assistance: Modified independent (Device/Increase time) Ambulation Distance (Feet): 20 Feet Assistive device: 4-wheeled walker Gait  Pattern/deviations: Step-to pattern Gait velocity: slow Gait velocity interpretation: Below normal speed for age/gender General Gait Details: limited ambulation due to MD orders, pt used darco shoe and was able to minimize WBing to heel.   Stairs            Wheelchair Mobility    Modified Rankin (Stroke Patients Only)       Balance Overall balance assessment: Needs assistance Sitting-balance support: Feet unsupported Sitting balance-Leahy Scale: Normal Sitting balance - Comments: pt able to sit EOB and don L prosthesis and R darco shoe at EOB without difficulties   Standing balance support: Bilateral upper extremity supported Standing balance-Leahy Scale: Poor Standing balance comment: requires RW                    Cognition Arousal/Alertness: Awake/alert Behavior During Therapy: WFL for tasks assessed/performed Overall Cognitive Status: Within Functional Limits for tasks assessed                      Exercises General Exercises - Lower Extremity Ankle Circles/Pumps: AROM;Both;10 reps;Supine Quad Sets: AROM;Right;5 reps;Supine Gluteal Sets: AROM;Right;Left;5 reps;Supine    General Comments General comments (skin integrity, edema, etc.): R foot with dressing intact      Pertinent Vitals/Pain Pain Assessment: 0-10 Pain Score: 7  Pain Descriptors / Indicators: Aching;Sore;Tender;Other (Comment) Pain Intervention(s): RN gave pain meds during session    Home Living                      Prior Function  PT Goals (current goals can now be found in the care plan section) Acute Rehab PT Goals Patient Stated Goal: going to a hotel Progress towards PT goals: Progressing toward goals    Frequency    Min 2X/week      PT Plan Frequency needs to be updated;Discharge plan needs to be updated    Co-evaluation             End of Session Equipment Utilized During Treatment:  (R darco) Activity Tolerance: Patient  tolerated treatment well Patient left: in bed;with call bell/phone within reach;with family/visitor present     Time: 9147-8295 PT Time Calculation (min) (ACUTE ONLY): 16 min  Charges:  $Gait Training: 8-22 mins                    G Codes:      Jeffrey Barber M Jeffrey Barber Aug 06, 2016, 12:02 PM   Jeffrey Barber, PT, DPT Pager #: [phone] Office #: [phone]

## 2016-07-25 NOTE — Care Management Note (Signed)
Case Management Note Donn PieriniKristi Jigar Zielke RN, BSN Unit 2W-Case Manager [phone]  Patient Details  Name: Marcelle OverlieGlenn D Mirabella MRN: [id number] Date of Birth: Feb 21, 1961  Subjective/Objective:   Pt admitted with cellulitis and gangrene of toes, s/p 2,3,4 toe amputation on 2/4                 Action/Plan: PTA pt lived at home alone (wife recently passed)- [facility]- spoke with pt at bedside- [facility]- depends on when he can f/u with doctor. Pt states he [place] all needed D[place] [facility]- here he has a quad cane, and his w/c- he also states that he has a rollator that is at his mom's that he could use. Since his wife died he has no insurance- states he can not afford HH services- out of pocket cost. Pt is going to have his sister bring his w/c to hospital so that pt can work with therapy to make sure he can safely transfer prior to discharge. CM will f/u for any further d/c needs in am.   Expected Discharge Date:  07/25/16  07/25/16             Expected Discharge Plan:  Home/Self Care  In-House Referral:     Discharge planning Services  CM Consult, Medication Assistance, MATCH Program  Post Acute Care Choice:  NA Choice offered to:  NA  DME Arranged:    DME Agency:     HH Arranged:    HH Agency:     Status of Service:  Completed, signed off  If discussed at [employer] Length of Stay Meetings, dates discussed:    Discharge Disposition: home/self care   Additional Comments:  07/25/16- 1645- Roel Douthat RN, CM- pt to d/c today- plan to stay in town at a hotel or his sister's home until f/u appointments [place] Pt has w/c at bedside- did well with PT this AM- [facility]- pt will have multiple prescriptions [facility] along with a list of pharmacies- program explained along with copay of $3 per script- pt voices understanding and  appreciation for assistance.   Darrold SpanWebster, Zondra Lawlor Hall, RN 07/25/2016, 4:59 PM

## 2016-07-25 NOTE — Telephone Encounter (Signed)
Morrie Sheldonshley RN [facility]. Patient is status post multiple toe amputation. 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 [facility] to maintain compliance with patient navigating steps and for transfers to weightbear through heel. Spoke with Denny PeonErin NP about this. Ok for [employer] and for patient to weightbear through the heel only for steps and transfers, minimize weightbearing to prevent incision dehiscence and decrease post operative swelling.

## 2016-07-25 NOTE — Telephone Encounter (Signed)
Sched lab 08/17/16 at 10:00 and MD 08/24/16 at 9:45. Pt's # has no vm, lm on emergency contact's sister's # to inform them of appts.

## 2016-07-25 NOTE — Telephone Encounter (Signed)
-----   Message from Sharee PimpleMarilyn K McChesney, RN sent at 07/25/2016 10:05 AM EST ----- Regarding: Schedule 3 weeks   ----- Message ----- From: Lars MageEmma M Collins, PA-C Sent: 07/25/2016   7:27 AM To: Vvs Charge Pool  F/U with Dr. Darrick PennaFields s/p angioplasty LE needs ABI's  3 weeks

## 2016-07-25 NOTE — Progress Notes (Signed)
Orthopedic Tech Progress Note Patient Details:  Jeffrey OverlieGlenn D Barber 22-Jun-1960 [id number]  Ortho Devices Type of Ortho Device: Darco shoe Ortho Device/Splint Location: rle Ortho Device/Splint Interventions: Application   Saul FordyceJennifer C Kerrigan [place] 07/25/2016, 9:47 AM
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
## 2016-07-27 ENCOUNTER — Encounter (INDEPENDENT_AMBULATORY_CARE_PROVIDER_SITE_OTHER): Payer: Self-pay | Admitting: Orthopedic Surgery

## 2016-07-27 ENCOUNTER — Ambulatory Visit (INDEPENDENT_AMBULATORY_CARE_PROVIDER_SITE_OTHER): Payer: Self-pay | Admitting: Orthopedic Surgery

## 2016-07-27 VITALS — Ht 70.0 in | Wt 179.0 lb

## 2016-07-27 DIAGNOSIS — T25331S Burn of third degree of right toe(s) (nail), sequela: Secondary | ICD-10-CM

## 2016-07-27 DIAGNOSIS — T25331A Burn of third degree of right toe(s) (nail), initial encounter: Secondary | ICD-10-CM | POA: Insufficient documentation

## 2016-07-27 DIAGNOSIS — E1142 Type 2 diabetes mellitus with diabetic polyneuropathy: Secondary | ICD-10-CM | POA: Insufficient documentation

## 2016-07-27 MED ORDER — NITROGLYCERIN 0.2 MG/HR TD PT24: 0.2000 mg | MEDICATED_PATCH | Freq: Every day | TRANSDERMAL | 12 refills | Status: AC

## 2016-07-27 MED ORDER — NITROGLYCERIN 0.2 MG/HR TD PT24: 0.2000 mg | MEDICATED_PATCH | Freq: Every day | TRANSDERMAL | 12 refills | Status: DC

## 2016-07-27 MED ORDER — MUPIROCIN 2 % EX OINT: 1.0000 "application " | TOPICAL_OINTMENT | Freq: Two times a day (BID) | CUTANEOUS | 3 refills | Status: DC

## 2016-07-27 NOTE — Addendum Note (Signed)
Addended by: Aldean BakerUDA, Kismet Facemire on: 07/27/2016 03:27 PM   Modules accepted: Orders
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
## 2016-07-31 ENCOUNTER — Inpatient Hospital Stay (INDEPENDENT_AMBULATORY_CARE_PROVIDER_SITE_OTHER): Payer: Self-pay | Admitting: Orthopedic Surgery

## 2016-08-03 ENCOUNTER — Ambulatory Visit (INDEPENDENT_AMBULATORY_CARE_PROVIDER_SITE_OTHER): Payer: Self-pay | Admitting: Orthopedic Surgery

## 2016-08-07 ENCOUNTER — Ambulatory Visit (INDEPENDENT_AMBULATORY_CARE_PROVIDER_SITE_OTHER): Payer: Self-pay | Admitting: Family

## 2016-08-07 ENCOUNTER — Encounter (INDEPENDENT_AMBULATORY_CARE_PROVIDER_SITE_OTHER): Payer: Self-pay | Admitting: Orthopedic Surgery

## 2016-08-07 DIAGNOSIS — IMO0002 Reserved for concepts with insufficient information to code with codable children: Secondary | ICD-10-CM

## 2016-08-07 DIAGNOSIS — Z89421 Acquired absence of other right toe(s): Secondary | ICD-10-CM

## 2016-08-07 DIAGNOSIS — T25331S Burn of third degree of right toe(s) (nail), sequela: Secondary | ICD-10-CM

## 2016-08-07 DIAGNOSIS — E1142 Type 2 diabetes mellitus with diabetic polyneuropathy: Secondary | ICD-10-CM

## 2016-08-07 MED ORDER — HYDROCODONE-ACETAMINOPHEN 5-325 MG PO TABS: 1.0000 | ORAL_TABLET | Freq: Two times a day (BID) | ORAL | 0 refills | Status: DC | PRN

## 2016-08-07 MED ORDER — MUPIROCIN 2 % EX OINT: 1.0000 "application " | TOPICAL_OINTMENT | Freq: Two times a day (BID) | CUTANEOUS | 3 refills | Status: AC
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
## 2016-08-17 ENCOUNTER — Encounter (HOSPITAL_COMMUNITY): Payer: Self-pay

## 2016-08-18 ENCOUNTER — Encounter: Payer: Self-pay | Admitting: Vascular Surgery

## 2016-08-24 ENCOUNTER — Ambulatory Visit: Payer: Self-pay | Admitting: Vascular Surgery

## 2016-08-30 ENCOUNTER — Encounter (HOSPITAL_COMMUNITY): Payer: Self-pay

## 2016-08-30 ENCOUNTER — Ambulatory Visit: Payer: Self-pay | Admitting: Vascular Surgery

## 2016-09-04 ENCOUNTER — Ambulatory Visit (INDEPENDENT_AMBULATORY_CARE_PROVIDER_SITE_OTHER): Payer: MEDICAID | Admitting: Orthopedic Surgery

## 2016-10-17 ENCOUNTER — Encounter: Payer: Self-pay | Admitting: Vascular Surgery

## 2016-10-26 ENCOUNTER — Ambulatory Visit: Payer: Self-pay

## 2016-10-26 ENCOUNTER — Ambulatory Visit (HOSPITAL_COMMUNITY): Payer: Medicaid - Out of State | Attending: Vascular Surgery

## 2016-11-18 ENCOUNTER — Encounter (HOSPITAL_COMMUNITY): Payer: Self-pay | Admitting: Orthopedic Surgery

## 2016-11-18 NOTE — Addendum Note (Signed)
Addendum  created 11/18/16 0905 by Estel Tonelli, MD   Sign clinical note    

## 2018-08-31 ENCOUNTER — Emergency Department (HOSPITAL_COMMUNITY)
Admission: EM | Admit: 2018-08-31 | Discharge: 2018-09-01 | Disposition: A | Discharge status: Home / Self Care | Payer: Medicaid - Out of State | Attending: Emergency Medicine | Admitting: Emergency Medicine

## 2018-08-31 ENCOUNTER — Encounter (HOSPITAL_COMMUNITY): Payer: Self-pay | Admitting: Emergency Medicine

## 2018-08-31 ENCOUNTER — Other Ambulatory Visit: Payer: Self-pay

## 2018-08-31 DIAGNOSIS — Z79899 Other long term (current) drug therapy: Secondary | ICD-10-CM | POA: Insufficient documentation

## 2018-08-31 DIAGNOSIS — Z794 Long term (current) use of insulin: Secondary | ICD-10-CM | POA: Diagnosis not present

## 2018-08-31 DIAGNOSIS — R1031 Right lower quadrant pain: Secondary | ICD-10-CM | POA: Diagnosis present

## 2018-08-31 DIAGNOSIS — Z7982 Long term (current) use of aspirin: Secondary | ICD-10-CM | POA: Diagnosis not present

## 2018-08-31 DIAGNOSIS — Z8673 Personal history of transient ischemic attack (TIA), and cerebral infarction without residual deficits: Secondary | ICD-10-CM | POA: Diagnosis not present

## 2018-08-31 DIAGNOSIS — I10 Essential (primary) hypertension: Secondary | ICD-10-CM | POA: Insufficient documentation

## 2018-08-31 DIAGNOSIS — E119 Type 2 diabetes mellitus without complications: Secondary | ICD-10-CM | POA: Insufficient documentation

## 2018-08-31 DIAGNOSIS — K4020 Bilateral inguinal hernia, without obstruction or gangrene, not specified as recurrent: Secondary | ICD-10-CM | POA: Diagnosis not present

## 2018-08-31 DIAGNOSIS — Z87891 Personal history of nicotine dependence: Secondary | ICD-10-CM | POA: Diagnosis not present

## 2018-08-31 LAB — URINALYSIS, ROUTINE W REFLEX MICROSCOPIC
BILIRUBIN URINE: NEGATIVE
Glucose, UA: 150 mg/dL — AB
Hgb urine dipstick: NEGATIVE
KETONES UR: NEGATIVE mg/dL
Leukocytes,Ua: NEGATIVE
Nitrite: NEGATIVE
Protein, ur: >=300 mg/dL — AB
Specific Gravity, Urine: 1.011 (ref 1.005–1.030)
pH: 5 (ref 5.0–8.0)

## 2018-08-31 LAB — CBC
HCT: 39.1 % (ref 39.0–52.0)
HEMOGLOBIN: 12.3 g/dL — AB (ref 13.0–17.0)
MCH: 26.8 pg (ref 26.0–34.0)
MCHC: 31.5 g/dL (ref 30.0–36.0)
MCV: 85.2 fL (ref 80.0–100.0)
Platelets: 384 10*3/uL (ref 150–400)
RBC: 4.59 MIL/uL (ref 4.22–5.81)
RDW: 15 % (ref 11.5–15.5)
WBC: 13.5 10*3/uL — ABNORMAL HIGH (ref 4.0–10.5)
nRBC: 0 % (ref 0.0–0.2)

## 2018-08-31 LAB — COMPREHENSIVE METABOLIC PANEL
ALBUMIN: 2.6 g/dL — AB (ref 3.5–5.0)
ALK PHOS: 92 U/L (ref 38–126)
ALT: 11 U/L (ref 0–44)
ANION GAP: 8 (ref 5–15)
AST: 22 U/L (ref 15–41)
BILIRUBIN TOTAL: 0.4 mg/dL (ref 0.3–1.2)
BUN: 12 mg/dL (ref 6–20)
CALCIUM: 8.5 mg/dL — AB (ref 8.9–10.3)
CO2: 21 mmol/L — AB (ref 22–32)
Chloride: 109 mmol/L (ref 98–111)
Creatinine, Ser: 1.34 mg/dL — ABNORMAL HIGH (ref 0.61–1.24)
GFR calc Af Amer: >60 mL/min (ref >60)
GFR calc non Af Amer: 58 mL/min — ABNORMAL LOW (ref >60)
GLUCOSE: 186 mg/dL — AB (ref 70–99)
POTASSIUM: 4.3 mmol/L (ref 3.5–5.1)
SODIUM: 138 mmol/L (ref 135–145)
TOTAL PROTEIN: 6 g/dL — AB (ref 6.5–8.1)

## 2018-08-31 LAB — LIPASE, BLOOD: Lipase: 38 U/L (ref 11–51)

## 2018-08-31 MED ORDER — ONDANSETRON HCL 4 MG/2ML IJ SOLN
4.0000 mg | Freq: Once | INTRAMUSCULAR | Status: AC
  Administered 2018-08-31: 4 mg via INTRAVENOUS
  Filled 2018-08-31: qty 2

## 2018-08-31 MED ORDER — SODIUM CHLORIDE 0.9% FLUSH
3.0000 mL | Freq: Once | INTRAVENOUS | Status: AC
  Administered 2018-08-31: 3 mL via INTRAVENOUS

## 2018-08-31 MED ORDER — MORPHINE SULFATE (PF) 4 MG/ML IV SOLN
4.0000 mg | Freq: Once | INTRAVENOUS | Status: AC
  Administered 2018-08-31: 4 mg via INTRAVENOUS
  Filled 2018-08-31: qty 1

## 2018-08-31 NOTE — ED Triage Notes (Addendum)
Pt arrives by POV with c/o of right sided hernia- Pt has had this hernia since around 2010- pt states after lifting heavy bag 3 days ago he had a protrusion in right groin with increase pain. Pt has not tried to reduce bulge states it is the size of an egg.
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
## 2018-09-01 ENCOUNTER — Emergency Department (HOSPITAL_COMMUNITY): Payer: Medicaid - Out of State

## 2018-09-01 MED ORDER — IOHEXOL 300 MG/ML  SOLN
100.0000 mL | Freq: Once | INTRAMUSCULAR | Status: AC | PRN
  Administered 2018-09-01: 100 mL via INTRAVENOUS

## 2018-09-01 MED ORDER — HYDROCODONE-ACETAMINOPHEN 5-325 MG PO TABS: 1.0000 | ORAL_TABLET | Freq: Four times a day (QID) | ORAL | 0 refills | Status: AC | PRN

## 2018-09-01 NOTE — ED Notes (Signed)
Discharge and prescription medication discussed with pt. And family member at bedside. Pt. Does not have any questions at this time. Pt. Discharged with family member, ambulatory and good condition.

## 2018-09-01 NOTE — Discharge Instructions (Addendum)
These follow-up with your doctor.  You may need to see a general surgeon regarding your hernias.  Your urine has been sent for culture.  If there is bacteria that grows, we will call you in an antibiotic.  Continue to follow-up with your doctor regarding the protein in your urine.

## 2018-09-02 LAB — URINE CULTURE: Culture: NO GROWTH

## 2018-09-05 ENCOUNTER — Telehealth (HOSPITAL_COMMUNITY): Payer: Self-pay

## 2018-10-03 ENCOUNTER — Telehealth: Payer: Self-pay | Admitting: General Surgery

## 2020-10-10 IMAGING — CT CT ABDOMEN AND PELVIS WITH CONTRAST
2 of 5 series · 16 of 46 positions shown, 18 images · IV contrast (Omni 300)
Comparison: None.

CLINICAL DATA: Hernia

EXAM:
CT ABDOMEN AND PELVIS WITH CONTRAST
TECHNIQUE: Multidetector CT imaging of the abdomen and pelvis was performed
using the standard protocol following bolus administration of
intravenous contrast.
CONTRAST:  100mL OMNIPAQUE IOHEXOL 300 MG/ML  SOLN

[Series 3: a/p w/ 5mm · axial · 0.86mm/px · z∈[-458,-58]mm · 13 of 92 slices shown, 15 images]
[im 6/92  soft-tissue]
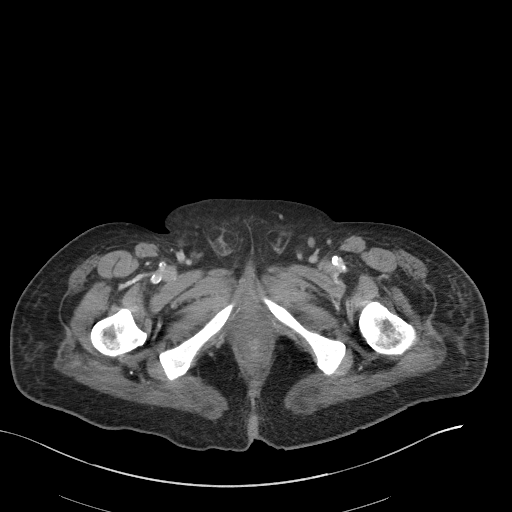
[im 6/92  bone]
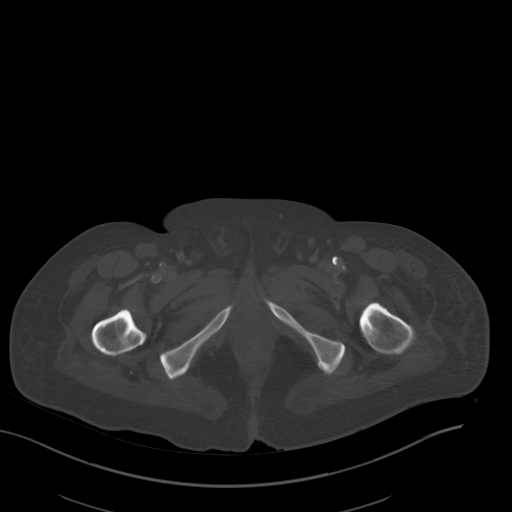
[im 12/92  soft-tissue]
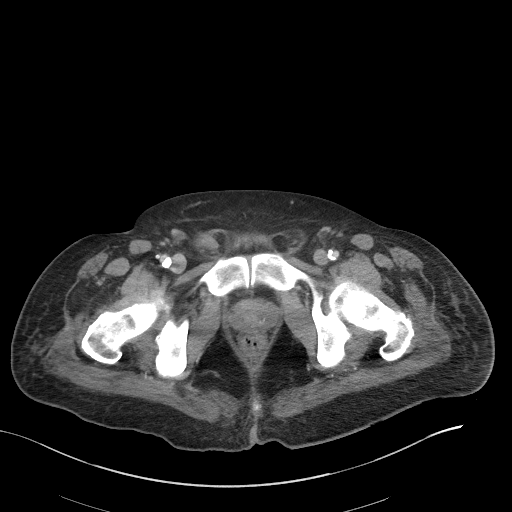
[im 18/92  soft-tissue]
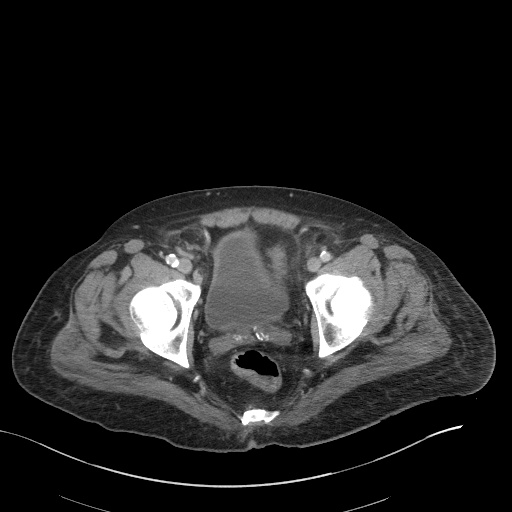
[im 29/92  soft-tissue]
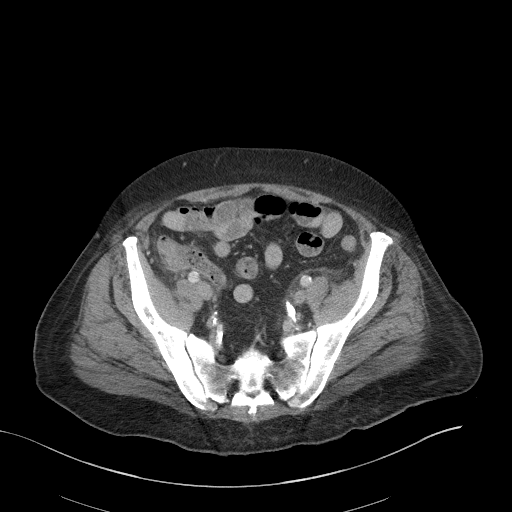
[im 35/92  soft-tissue]
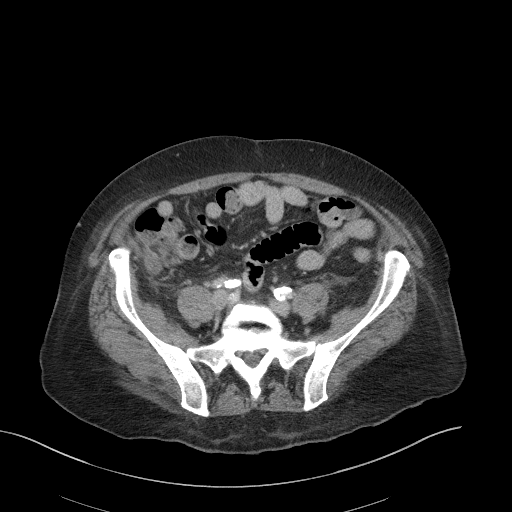
[im 40/92  soft-tissue]
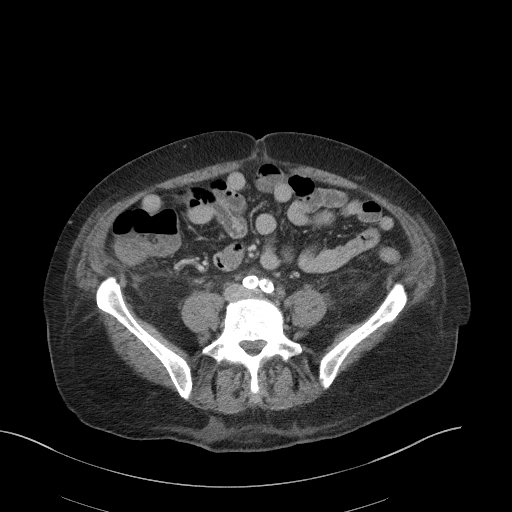
[im 46/92  soft-tissue]
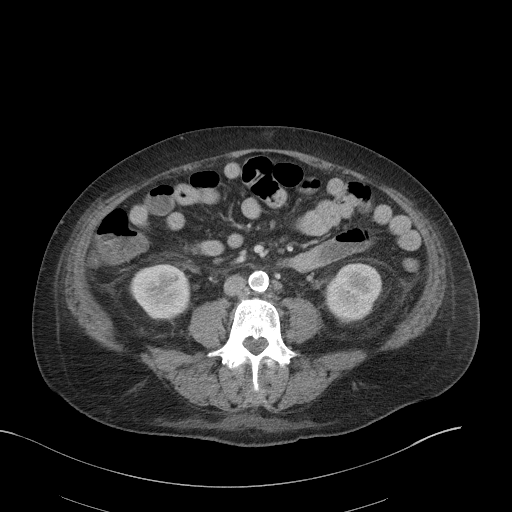
[im 52/92  soft-tissue]
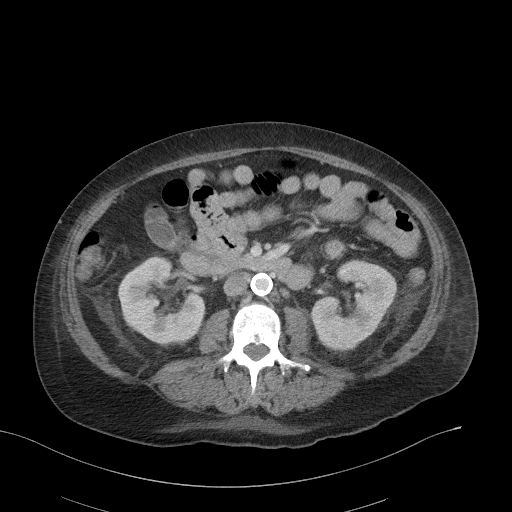
[im 57/92  soft-tissue]
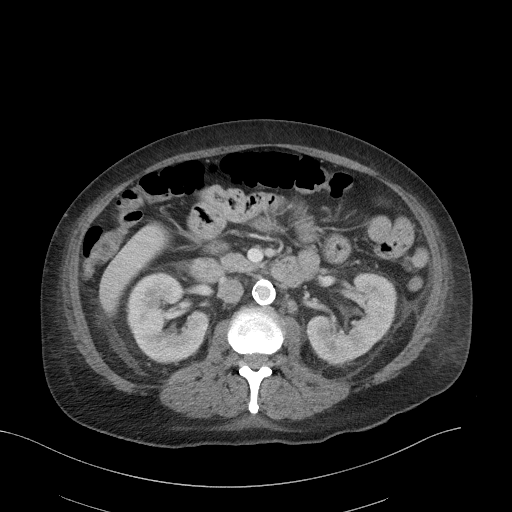
[im 57/92  bone]
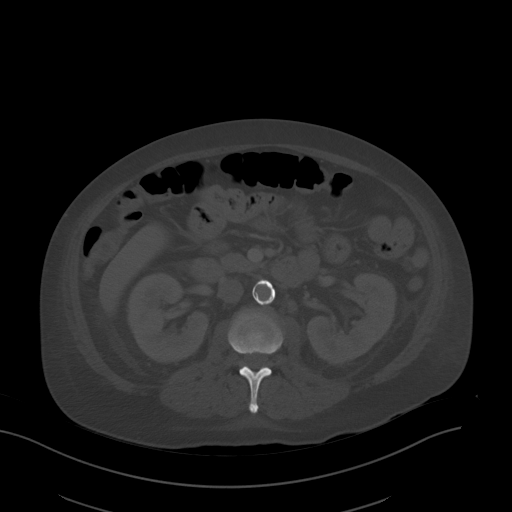
[im 63/92  soft-tissue]
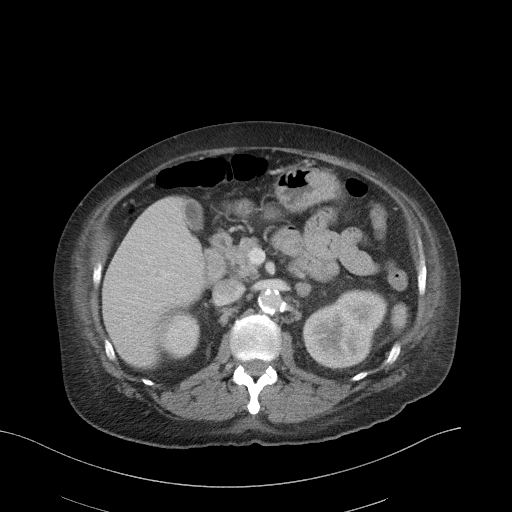
[im 74/92  soft-tissue]
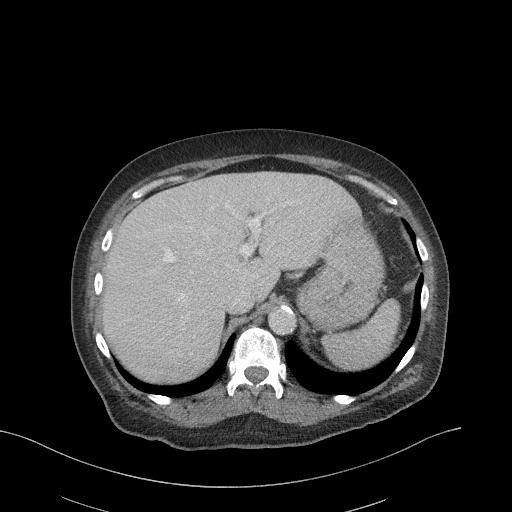
[im 80/92  soft-tissue]
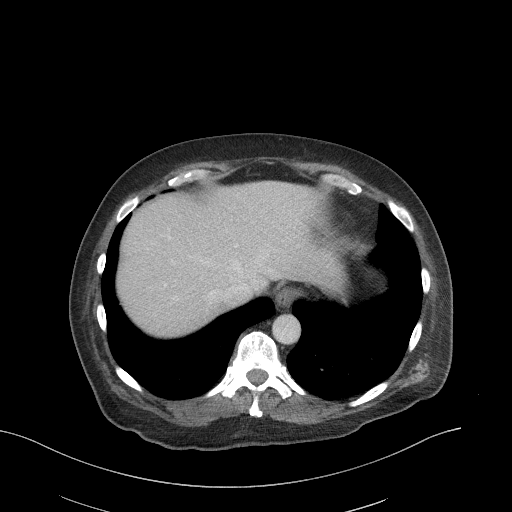
[im 86/92  soft-tissue]
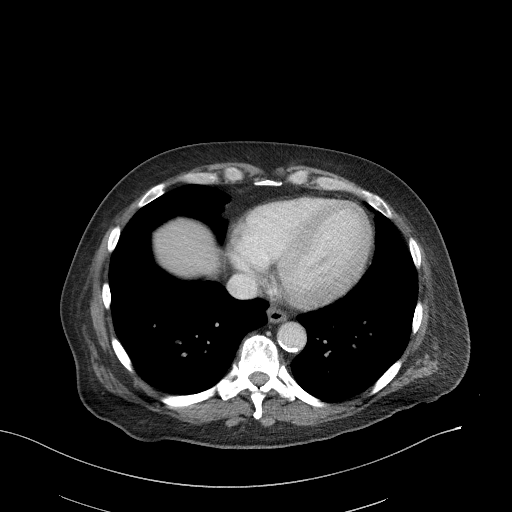

[Series 6: a/p w/ cor · coronal · 0.77mm/px · 3 of 131 slices shown]
[im 44/131  soft-tissue]
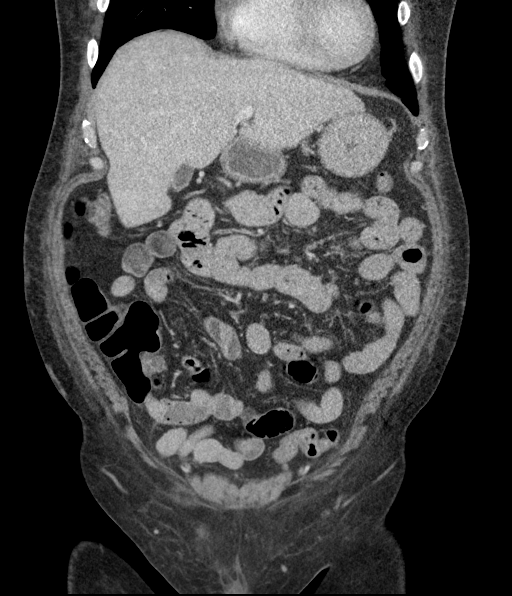
[im 58/131  soft-tissue]
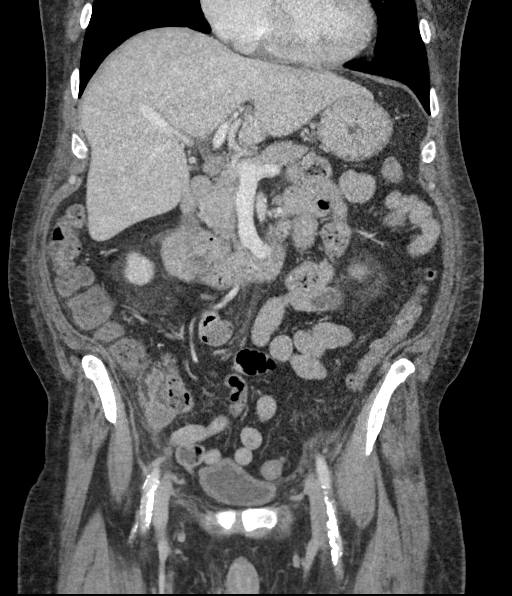
[im 73/131  soft-tissue]
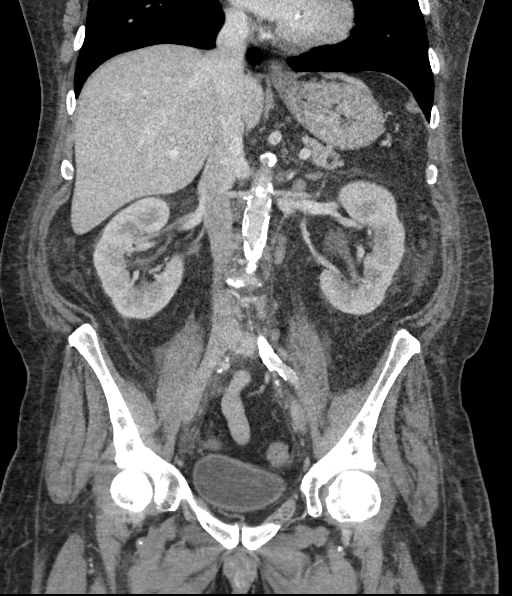

[16 of 46 positions shown; findings below may reference images not displayed]

FINDINGS: Lower chest: Lung bases demonstrate no acute consolidation or
effusion. The heart size is normal.

Hepatobiliary: No focal liver abnormality is seen. No gallstones,
gallbladder wall thickening, or biliary dilatation.

Pancreas: Unremarkable. No pancreatic ductal dilatation or
surrounding inflammatory changes.

Spleen: Normal in size without focal abnormality.

Adrenals/Urinary Tract: Adrenal glands are normal. Cyst in the upper
pole left kidney. No hydronephrosis. Bilateral perinephric fluid and
edema. The bladder is normal.

Stomach/Bowel: Slightly thick-walled appearance of the stomach which
is collapsed. No dilated small bowel. No bowel wall thickening.
Negative appendix. Sigmoid colon diverticula without acute
inflammatory change.

Vascular/Lymphatic: Severe aortic atherosclerosis. No aneurysm.
Mildly prominent retroperitoneal lymph nodes measuring up to 16 mm.
Small gastrohepatic lymph nodes.

Reproductive: Prostate is unremarkable.

Other: Negative for free air or free fluid. Bilateral fat containing
inguinal hernias, right greater than left.

Musculoskeletal: Degenerative changes. No acute or suspicious
abnormality
IMPRESSION: 1. Bilateral inguinal hernias, small on the left and small moderate
on the right, containing only fat. Mild edema within the right
inguinal hernia sac. No bowel containing hernia.
2. Moderate perinephric fluid and edema, nonspecific, could be seen
with kidney infection or chronic kidney disease. Suggest correlation
with renal function laboratory values.
3. Sigmoid colon diverticula without acute inflammatory change.

## 2022-12-18 DEATH — deceased

## 2024-01-29 NOTE — Telephone Encounter (Signed)
 Opened in error
